# Patient Record
Sex: Female | Born: 1975
Health system: Southern US, Community
[De-identification: ages and names within clinical notes are randomized; demographics above are authoritative.]

## PROBLEM LIST (undated history)

## (undated) DIAGNOSIS — N939 Abnormal uterine and vaginal bleeding, unspecified: Secondary | ICD-10-CM

## (undated) DIAGNOSIS — T7840XA Allergy, unspecified, initial encounter: Secondary | ICD-10-CM

## (undated) DIAGNOSIS — G5603 Carpal tunnel syndrome, bilateral upper limbs: Secondary | ICD-10-CM

## (undated) DIAGNOSIS — F319 Bipolar disorder, unspecified: Secondary | ICD-10-CM

## (undated) DIAGNOSIS — E079 Disorder of thyroid, unspecified: Secondary | ICD-10-CM

## (undated) DIAGNOSIS — D649 Anemia, unspecified: Secondary | ICD-10-CM

## (undated) DIAGNOSIS — N946 Dysmenorrhea, unspecified: Secondary | ICD-10-CM

## (undated) DIAGNOSIS — F909 Attention-deficit hyperactivity disorder, unspecified type: Secondary | ICD-10-CM

## (undated) DIAGNOSIS — F419 Anxiety disorder, unspecified: Secondary | ICD-10-CM

## (undated) DIAGNOSIS — E282 Polycystic ovarian syndrome: Secondary | ICD-10-CM

## (undated) DIAGNOSIS — F329 Major depressive disorder, single episode, unspecified: Secondary | ICD-10-CM

## (undated) DIAGNOSIS — R42 Dizziness and giddiness: Secondary | ICD-10-CM

## (undated) DIAGNOSIS — F32A Depression, unspecified: Secondary | ICD-10-CM

## (undated) DIAGNOSIS — R87619 Unspecified abnormal cytological findings in specimens from cervix uteri: Secondary | ICD-10-CM

## (undated) DIAGNOSIS — R7989 Other specified abnormal findings of blood chemistry: Secondary | ICD-10-CM

## (undated) DIAGNOSIS — G4730 Sleep apnea, unspecified: Secondary | ICD-10-CM

## (undated) DIAGNOSIS — A64 Unspecified sexually transmitted disease: Secondary | ICD-10-CM

## (undated) HISTORY — DX: Unspecified sexually transmitted disease: A64

## (undated) HISTORY — DX: Polycystic ovarian syndrome: E28.2

## (undated) HISTORY — DX: Disorder of thyroid, unspecified: E07.9

## (undated) HISTORY — DX: Bipolar disorder, unspecified: F31.9

## (undated) HISTORY — DX: Allergy, unspecified, initial encounter: T78.40XA

## (undated) HISTORY — DX: Carpal tunnel syndrome, bilateral upper limbs: G56.03

## (undated) HISTORY — PX: COLPOSCOPY: SHX161

## (undated) HISTORY — PX: BELPHAROPTOSIS REPAIR: SHX369

## (undated) HISTORY — DX: Dysmenorrhea, unspecified: N94.6

## (undated) HISTORY — PX: GUM SURGERY: SHX658

## (undated) HISTORY — DX: Abnormal uterine and vaginal bleeding, unspecified: N93.9

## (undated) HISTORY — PX: UPPER GASTROINTESTINAL ENDOSCOPY: SHX188

## (undated) HISTORY — DX: Depression, unspecified: F32.A

## (undated) HISTORY — DX: Attention-deficit hyperactivity disorder, unspecified type: F90.9

## (undated) HISTORY — DX: Other specified abnormal findings of blood chemistry: R79.89

## (undated) HISTORY — DX: Major depressive disorder, single episode, unspecified: F32.9

## (undated) HISTORY — DX: Anemia, unspecified: D64.9

## (undated) HISTORY — DX: Anxiety disorder, unspecified: F41.9

## (undated) HISTORY — DX: Unspecified abnormal cytological findings in specimens from cervix uteri: R87.619

## (undated) HISTORY — DX: Dizziness and giddiness: R42

## (undated) HISTORY — PX: EYE SURGERY: SHX253

## (undated) HISTORY — DX: Sleep apnea, unspecified: G47.30

---
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
## 1998-12-01 ENCOUNTER — Other Ambulatory Visit: Admission: RE | Admit: 1998-12-01 | Discharge: 1998-12-01 | Payer: Self-pay | Admitting: Obstetrics and Gynecology

## 2000-01-08 ENCOUNTER — Ambulatory Visit (HOSPITAL_COMMUNITY): Admission: RE | Admit: 2000-01-08 | Discharge: 2000-01-08 | Payer: Self-pay | Admitting: Internal Medicine

## 2000-01-08 ENCOUNTER — Encounter (INDEPENDENT_AMBULATORY_CARE_PROVIDER_SITE_OTHER): Payer: Self-pay

## 2003-03-28 ENCOUNTER — Encounter: Payer: Self-pay | Admitting: Neurology

## 2003-03-28 ENCOUNTER — Ambulatory Visit (HOSPITAL_COMMUNITY): Admission: RE | Admit: 2003-03-28 | Discharge: 2003-03-28 | Payer: Self-pay | Admitting: Neurology

## 2007-02-04 ENCOUNTER — Other Ambulatory Visit: Admission: RE | Admit: 2007-02-04 | Discharge: 2007-02-04 | Payer: Self-pay | Admitting: Gynecology

## 2008-08-05 HISTORY — PX: CARPAL TUNNEL RELEASE: SHX101

## 2010-12-21 ENCOUNTER — Other Ambulatory Visit (HOSPITAL_COMMUNITY): Payer: Self-pay | Admitting: Neurology

## 2010-12-21 DIAGNOSIS — G253 Myoclonus: Secondary | ICD-10-CM

## 2010-12-21 DIAGNOSIS — R42 Dizziness and giddiness: Secondary | ICD-10-CM

## 2010-12-21 DIAGNOSIS — R209 Unspecified disturbances of skin sensation: Secondary | ICD-10-CM

## 2010-12-21 NOTE — Procedures (Signed)
[facility]  Patient:    LAPARIS, DURRETT               MRN: [id number] Adm. Date:  [id number] Disc. Date: [id number] Attending:  Mervin Hack CC:         Hedwig Morton. Juanda Chance, M.D. [facility]                           Procedure Report  PROCEDURE:  Flexible sigmoidoscopy.  INDICATIONS:  This 35 year old white female presented with two-month history of intermittent rectal bleeding.  In the past few days, there has been a large amount of blood in the commode, not associated with any rectal pain.  On physical exam yesterday, stool was Hemoccult-positive and she had tenderness in the left lower quadrant.  Her hemoglobin was 12.5.  She had small hemorrhoids which did not appear to be bleeding.  She is undergoing flexible sigmoidoscopy to rule out inflammatory bowel disease of the left colon.  ENDOSCOPIST:  Hedwig Morton. Juanda Chance, M.D.  ENDOSCOPE:  Olympus single-channel videoscope.  SEDATION:  Versed 7.5 IV, Demerol 100 mg IV.  FINDINGS:  Olympus single-channel videoscope was passed directly into the rectum to the sigmoid colon.  Patient was monitored by pulse oximetry; her oxygen saturations were normal.  Anal canal and perianal area were normal. There was a macerated area at the dentate line, with some mucosal abrasions and circumferential edema and hyperemia.  This was consistent with anusitis. Retroflexion of the endoscope in the rectum did not reveal any significant hemorrhoids.  Mucosa of the rectal ampulla was otherwise normal.  No evidence of proctitis.  Mucosa through the sigmoid colon appeared normal except for some patchy erythema which was attributed to the scope and prep.  Biopsies were taken from 10 to 15 cm to rule out microscopic colitis.  Colonoscope passed easily through the descending colon to the splenic flexure and to the mid-transverse colon.  Mucosa appeared normal.  At that point, colonoscope was then retracted and the colon decompressed.   Patient tolerated the procedure well.  IMPRESSION:  Anusitis; otherwise, normal colon to 90 cm, status post random biopsies of the rectosigmoid.  PLAN: 1. Treat for anal disease using Analpram-HC cream 2.5%. 2. Anusol-HC suppository q.h.s. 3. High-fiber diet. 4. Fibercon two tablets daily. 5. Modify her eating habits to include three meals a day and lots of liquids. DD:  01/08/00 TD:  01/11/00 Job: [id number]

## 2010-12-25 ENCOUNTER — Ambulatory Visit (HOSPITAL_COMMUNITY)
Admission: RE | Admit: 2010-12-25 | Discharge: 2010-12-25 | Disposition: A | Discharge status: Home / Self Care | Payer: PRIVATE HEALTH INSURANCE | Source: Ambulatory Visit | Attending: Neurology | Admitting: Neurology

## 2010-12-25 DIAGNOSIS — R209 Unspecified disturbances of skin sensation: Secondary | ICD-10-CM | POA: Insufficient documentation

## 2010-12-25 DIAGNOSIS — R259 Unspecified abnormal involuntary movements: Secondary | ICD-10-CM | POA: Insufficient documentation

## 2010-12-25 DIAGNOSIS — R42 Dizziness and giddiness: Secondary | ICD-10-CM

## 2010-12-25 DIAGNOSIS — G253 Myoclonus: Secondary | ICD-10-CM

## 2010-12-25 MED ORDER — GADOBENATE DIMEGLUMINE 529 MG/ML IV SOLN
15.0000 mL | Freq: Once | INTRAVENOUS | Status: AC
  Administered 2010-12-25: 15 mL via INTRAVENOUS

## 2012-01-31 ENCOUNTER — Ambulatory Visit (HOSPITAL_BASED_OUTPATIENT_CLINIC_OR_DEPARTMENT_OTHER): Payer: PRIVATE HEALTH INSURANCE | Attending: Internal Medicine

## 2012-01-31 VITALS — Ht 65.0 in | Wt 157.0 lb

## 2012-01-31 DIAGNOSIS — G4733 Obstructive sleep apnea (adult) (pediatric): Secondary | ICD-10-CM

## 2012-01-31 DIAGNOSIS — G4700 Insomnia, unspecified: Secondary | ICD-10-CM | POA: Insufficient documentation

## 2012-01-31 DIAGNOSIS — G4730 Sleep apnea, unspecified: Secondary | ICD-10-CM | POA: Insufficient documentation

## 2012-02-06 DIAGNOSIS — G4730 Sleep apnea, unspecified: Secondary | ICD-10-CM

## 2012-02-06 DIAGNOSIS — G4700 Insomnia, unspecified: Secondary | ICD-10-CM

## 2012-02-06 NOTE — Procedures (Signed)
NAME:  Lisa Whitney, SIGNORE        ACCOUNT NO.:  [id number]  MEDICAL RECORD NO.:  [id number]          PATIENT TYPE:  OUT  LOCATION:  SLEEP CENTER                 FACILITY[facility]  [facility]  PHYSICIAN:  Mistey Hoffert D. Maple Hudson, MD, FCCP, FACPDATE OF BIRTH:  05-18-1976  DATE OF STUDY:  01/31/2012                           NOCTURNAL POLYSOMNOGRAM  REFERRING PHYSICIAN:  Gwen Pounds, MD  REFERRING DOCTOR:  Gwen Pounds, MD  INDICATION FOR STUDY:  Insomnia with sleep apnea.  EPWORTH SLEEPINESS SCORE:  12/24.  BMI:  26.1.  Weight 157 pounds, height 65 inches, neck 13 inches.  HOME MEDICATIONS:  Charted and reviewed.  SLEEP ARCHITECTURE:  Total sleep time 211.5 minutes with sleep efficiency 51.6%.  Stage I was 13.9%, stage II was 73%, stage III was 11.8%, REM 1.2% of total sleep time.  Sleep latency 16 minutes, REM latency 61.5 minutes, awake after sleep onset 170.5 minutes, arousal index 9.6.  Bedtime medications:  Lamotrigine, Xanax, metformin.  Sleep pattern was marked by frequent and sustained episodes of spontaneous wakefulness with almost no REM.  RESPIRATORY DATA:  Apnea-hypopnea index (AHI) 0 per hour.  No respiratory events met scoring criteria.  OXYGEN DATA:  Moderate snoring with oxygen desaturation to a nadir of 96% and mean oxygen saturation through the study of 97.5% on room air.  CARDIAC DATA:  Sinus rhythm with occasional PAC.  MOVEMENT/PARASOMNIA:  A few limb jerks were counted with insignificant effect on sleep.  Bathroom x1.  IMPRESSION/RECOMMENDATIONS: 1. Sleep architecture was significant for repeated spontaneous     intervals of wakefulness throughout the night and very little REM.     Consider managing as an insomnia. 2. No respiratory events associated with sleep disturbance, within     normal limits.  AHI 0 per hour (the normal     range for adults is from 0-5 events per hour).  Moderate snoring     with oxygen desaturation to a nadir of a 96% and mean oxygen  saturation through the study of 97.5% on room air.     Raylene Carmickle D. Maple Hudson, MD, [facility], FACP Diplomate, American Board of Sleep Medicine    CDY/MEDQ  D:  02/06/2012 09:11:14  T:  02/06/2012 18:54:18  Job:  [id number]

## 2012-02-11 ENCOUNTER — Encounter (HOSPITAL_BASED_OUTPATIENT_CLINIC_OR_DEPARTMENT_OTHER): Payer: PRIVATE HEALTH INSURANCE

## 2012-05-14 LAB — HM PAP SMEAR: HM Pap smear: NEGATIVE

## 2012-08-05 HISTORY — PX: CO2 LASER APPLICATION: SHX5778

## 2012-11-03 ENCOUNTER — Telehealth: Payer: Self-pay | Admitting: Gynecology

## 2012-11-03 MED ORDER — METFORMIN HCL 500 MG PO TABS: 500.0000 mg | ORAL_TABLET | Freq: Two times a day (BID) | ORAL | Status: DC

## 2012-11-03 NOTE — Telephone Encounter (Signed)
Metformin 500 mg #60/10 rf;s sent to [facility] pt is aware.

## 2012-11-03 NOTE — Telephone Encounter (Signed)
Per Dr. Farrel Gobble #60/10 rf's approved and sent to [facility]; pt is aware

## 2012-11-03 NOTE — Telephone Encounter (Signed)
Pt calling to get an refill on her medication. Pt states the pharmacy has been waiting for over a week with no response. Pt states she is completely out

## 2012-11-04 ENCOUNTER — Telehealth: Payer: Self-pay | Admitting: *Deleted

## 2012-11-05 NOTE — Telephone Encounter (Signed)
i thought I ok'd this yesterday, please confirm

## 2012-11-05 NOTE — Telephone Encounter (Signed)
Done by Reggy Eye 11/03/12

## 2012-11-10 ENCOUNTER — Other Ambulatory Visit: Payer: Self-pay

## 2012-11-10 DIAGNOSIS — Z1231 Encounter for screening mammogram for malignant neoplasm of breast: Secondary | ICD-10-CM

## 2012-11-11 ENCOUNTER — Ambulatory Visit
Admission: RE | Admit: 2012-11-11 | Discharge: 2012-11-11 | Disposition: A | Discharge status: Home / Self Care | Payer: No Typology Code available for payment source | Source: Ambulatory Visit

## 2012-11-11 DIAGNOSIS — Z1231 Encounter for screening mammogram for malignant neoplasm of breast: Secondary | ICD-10-CM

## 2012-11-12 ENCOUNTER — Ambulatory Visit (INDEPENDENT_AMBULATORY_CARE_PROVIDER_SITE_OTHER): Payer: BC Managed Care – PPO | Admitting: Physician Assistant

## 2012-11-12 VITALS — BP 116/72 | HR 81 | Temp 98.4°F | Resp 18 | Ht 65.0 in | Wt 150.0 lb

## 2012-11-12 DIAGNOSIS — E282 Polycystic ovarian syndrome: Secondary | ICD-10-CM | POA: Insufficient documentation

## 2012-11-12 DIAGNOSIS — H60399 Other infective otitis externa, unspecified ear: Secondary | ICD-10-CM

## 2012-11-12 DIAGNOSIS — H6692 Otitis media, unspecified, left ear: Secondary | ICD-10-CM

## 2012-11-12 DIAGNOSIS — H60392 Other infective otitis externa, left ear: Secondary | ICD-10-CM

## 2012-11-12 DIAGNOSIS — F319 Bipolar disorder, unspecified: Secondary | ICD-10-CM | POA: Insufficient documentation

## 2012-11-12 DIAGNOSIS — H6690 Otitis media, unspecified, unspecified ear: Secondary | ICD-10-CM

## 2012-11-12 DIAGNOSIS — E039 Hypothyroidism, unspecified: Secondary | ICD-10-CM | POA: Insufficient documentation

## 2012-11-12 MED ORDER — AMOXICILLIN 875 MG PO TABS: 875.0000 mg | ORAL_TABLET | Freq: Two times a day (BID) | ORAL | Status: AC

## 2012-11-12 MED ORDER — OFLOXACIN 0.3 % OT SOLN: 10.0000 [drp] | Freq: Every day | OTIC | Status: AC

## 2012-11-12 NOTE — Progress Notes (Signed)
   [address], [place] [address]   Phone [phone]  Subjective:    Patient ID: Lisa Whitney, female    DOB: August 20, 1975, 37 y.o.   MRN: [id number]  HPI  Pt presents to clinic with acute L ear pain that started this afternoon that has gotten progressively worse.  It is radiating into her throat and behind her eye.  She has had no recent colds or allergy symptoms.  No trauma to ear that she knows of.  She tried a homeopathic ear gtt and she got no relief.  She is having decreased hearing in that ear - everything is muffled and echoing.  Review of Systems  Constitutional: Negative for fever and chills.  HENT: Positive for hearing loss (just started after she put drops in her ear) and ear pain (L side). Negative for congestion, sore throat, rhinorrhea and ear discharge.   Respiratory: Negative for cough.        Objective:   Physical Exam  Vitals reviewed. Constitutional: She is oriented to person, place, and time. She appears well-developed and well-nourished.  HENT:  Head: Normocephalic and atraumatic.  Right Ear: Hearing, tympanic membrane, external ear and ear canal normal.  Left Ear: Hearing normal. There is swelling (mild swelling of external canal) and tenderness (tragus TTP). No foreign bodies. Tympanic membrane is erythematous and bulging (purulent drainage on her TM).  Cardiovascular: Normal rate, regular rhythm and normal heart sounds.   Pulmonary/Chest: Effort normal and breath sounds normal.  Neurological: She is alert and oriented to person, place, and time.  Skin: Skin is warm and dry.  Psychiatric: She has a normal mood and affect. Her behavior is normal. Judgment and thought content normal.       Assessment & Plan:  Otitis media, left - Plan: amoxicillin (AMOXIL) 875 MG tablet  Otitis, externa, infective, left - Plan: ofloxacin (FLOXIN) 0.3 % otic solution  Benny Lennert PA-C 11/12/2012 9:40 PM

## 2013-05-15 ENCOUNTER — Emergency Department (INDEPENDENT_AMBULATORY_CARE_PROVIDER_SITE_OTHER): Payer: BC Managed Care – PPO

## 2013-05-15 ENCOUNTER — Encounter (HOSPITAL_COMMUNITY): Payer: Self-pay | Admitting: Emergency Medicine

## 2013-05-15 ENCOUNTER — Emergency Department (HOSPITAL_COMMUNITY)
Admission: EM | Admit: 2013-05-15 | Discharge: 2013-05-15 | Disposition: A | Discharge status: Home / Self Care | Payer: BC Managed Care – PPO | Source: Home / Self Care

## 2013-05-15 DIAGNOSIS — R1011 Right upper quadrant pain: Secondary | ICD-10-CM

## 2013-05-15 DIAGNOSIS — R1012 Left upper quadrant pain: Secondary | ICD-10-CM

## 2013-05-15 DIAGNOSIS — K5900 Constipation, unspecified: Secondary | ICD-10-CM

## 2013-05-15 DIAGNOSIS — R1031 Right lower quadrant pain: Secondary | ICD-10-CM

## 2013-05-15 DIAGNOSIS — R1032 Left lower quadrant pain: Secondary | ICD-10-CM

## 2013-05-15 LAB — POCT URINALYSIS DIP (DEVICE)
Glucose, UA: NEGATIVE mg/dL
Ketones, ur: NEGATIVE mg/dL
Leukocytes, UA: NEGATIVE
Protein, ur: NEGATIVE mg/dL

## 2013-05-15 LAB — POCT PREGNANCY, URINE: Preg Test, Ur: NEGATIVE

## 2013-05-15 MED ORDER — POLYETHYLENE GLYCOL 3350 17 GM/SCOOP PO POWD: 17.0000 g | Freq: Every day | ORAL | Status: DC

## 2013-05-15 NOTE — ED Notes (Signed)
Pt c/o epigastric and lower abd pain onset Thursday Sxs include: nauseas, weakness, dizziness, decreased appetite and not keeping fluids down abd pain increases w/deep breaths Denies: f/v/d, constipation, urinary sxs, gyn sxs She is alert w/no signs of acute distress.

## 2013-05-15 NOTE — ED Provider Notes (Signed)
CSN: [id number]     Arrival date & time 05/15/13  1200 History   First MD Initiated Contact with Patient 05/15/13 1357     Chief Complaint  Patient presents with  . Abdominal Pain   (Consider location/radiation/quality/duration/timing/severity/associated sxs/prior Treatment) HPI Comments: And 37 year old female presenting with generalized number pain for 48 hours. The pain is primarily across the upper abdomen and epigastrium. She states eating food or drinking water or other liquids produce a sudden pain across the upper abdomen. She has nausea but no vomiting. Decreased appetite but no fever. She initially thought she was constipated but then file not. She has been having some bowel movement she states her last good cleansing bowel movement she cannot remember.   Past Medical History  Diagnosis Date  . Anxiety   . Depression   . Anemia   . Thyroid disease   . Bipolar affective disorder   . PCOS (polycystic ovarian syndrome)    Past Surgical History  Procedure Laterality Date  . Carpal tunnel release    . Eye surgery     Family History  Problem Relation Age of Onset  . Cancer Mother    History  Substance Use Topics  . Smoking status: Former Games developer  . Smokeless tobacco: Not on file  . Alcohol Use: Yes   OB History   Grav Para Term Preterm Abortions TAB SAB Ect Mult Living                 Review of Systems  Constitutional: Positive for appetite change. Negative for fever, chills and fatigue.  HENT: Negative.   Respiratory: Negative.   Cardiovascular: Negative.   Gastrointestinal: Positive for nausea, abdominal pain and anal bleeding. Negative for vomiting, diarrhea, abdominal distention and rectal pain.  Genitourinary: Negative.   Musculoskeletal: Negative.   Skin: Negative.   Neurological: Negative.     Allergies  Review of patient's allergies indicates no known allergies.  Home Medications   Current Outpatient Rx  Name  Route  Sig  Dispense  Refill  .  ALPRAZolam (XANAX) 1 MG tablet   Oral   Take 1 mg by mouth at bedtime as needed for sleep.         [name] amphetamine-dextroamphetamine (ADDERALL) 30 MG tablet   Oral   Take 30 mg by mouth daily.         [name] lamoTRIgine (LAMICTAL) 200 MG tablet   Oral   Take 300 mg by mouth daily.         [name] levothyroxine (SYNTHROID, LEVOTHROID) 100 MCG tablet   Oral   Take 100 mcg by mouth daily before breakfast.         . lurasidone (LATUDA) 40 MG TABS   Oral   Take by mouth daily with breakfast.         . metFORMIN (GLUCOPHAGE) 500 MG tablet   Oral   Take 1 tablet (500 mg total) by mouth 2 (two) times daily with a meal.   60 tablet   10   . polyethylene glycol powder (GLYCOLAX/MIRALAX) powder   Oral   Take 17 g by mouth daily.   255 g   0    BP 117/67  Pulse 80  Temp(Src) 98.3 F (36.8 C) (Oral)  Resp 23  SpO2 99%  LMP 05/05/2013 Physical Exam  Nursing note and vitals reviewed. Constitutional: She is oriented to person, place, and time. She appears well-developed and well-nourished. No distress.  Eyes: EOM are normal.  Neck: Normal range  of motion. Neck supple.  Cardiovascular: Normal rate, regular rhythm and normal heart sounds.   No murmur heard. Pulmonary/Chest: Effort normal and breath sounds normal. No respiratory distress. She has no wheezes.  Abdominal: Soft. She exhibits no distension and no mass. There is tenderness. There is no rebound and no guarding.  Bowel sounds hyperactive are present in all quadrants. Tenderness to light palpation across the upper abdomen and epigastrium. Lesser tenderness across the lower abdomen.  Musculoskeletal: She exhibits no edema and no tenderness.  Neurological: She is alert and oriented to person, place, and time. She exhibits normal muscle tone.  Skin: Skin is warm and dry. No rash noted.  Psychiatric: She has a normal mood and affect.    ED Course  Procedures (including critical care time) Labs Review Labs Reviewed  POCT  URINALYSIS DIP (DEVICE)  POCT PREGNANCY, URINE   Imaging Review No results found.  No results found. Results for orders placed during the hospital encounter of 05/15/13  POCT URINALYSIS DIP (DEVICE)      Result Value Range   Glucose, UA NEGATIVE  NEGATIVE mg/dL   Bilirubin Urine NEGATIVE  NEGATIVE   Ketones, ur NEGATIVE  NEGATIVE mg/dL   Specific Gravity, Urine 1.010  1.005 - 1.030   Hgb urine dipstick NEGATIVE  NEGATIVE   pH 7.0  5.0 - 8.0   Protein, ur NEGATIVE  NEGATIVE mg/dL   Urobilinogen, UA 0.2  0.0 - 1.0 mg/dL   Nitrite NEGATIVE  NEGATIVE   Leukocytes, UA NEGATIVE  NEGATIVE  POCT PREGNANCY, URINE      Result Value Range   Preg Test, Ur NEGATIVE  NEGATIVE     MDM   1. Abdominal pain, bilateral upper quadrant   2. Abdominal pain, bilateral lower quadrant   3. Constipation      Moderate to large stool burden, gas. Drink plenty of water Miralax as directed High fiber diet. Follow with your doctor if not improving. If getting worse go to the ED   Hayden Rasmussen, NP 05/15/13 1455

## 2013-05-16 NOTE — ED Provider Notes (Signed)
Medical screening examination/treatment/procedure(s) were performed by resident physician or non-physician practitioner and as supervising physician I was immediately available for consultation/collaboration.   Barkley Bruns MD.   Linna Hoff, MD 05/16/13 1239

## 2013-10-26 ENCOUNTER — Other Ambulatory Visit: Payer: Self-pay | Admitting: *Deleted

## 2013-10-26 NOTE — Telephone Encounter (Signed)
Last AEX 05/14/2012 Last refill 11/03/2012 #60/10 refills No future appt scheduled. Paper Chart in your door.  Please approve or deny rx.

## 2013-10-26 NOTE — Telephone Encounter (Signed)
NEEDS APPT FOR F/U/ANNUAL THEN CAN REFILL TO APPT

## 2013-10-27 MED ORDER — METFORMIN HCL 500 MG PO TABS: 500.0000 mg | ORAL_TABLET | Freq: Two times a day (BID) | ORAL | Status: DC

## 2013-10-27 NOTE — Addendum Note (Signed)
Addended by: Dion BodyBELTRAN, Richell Corker C on: 10/27/2013 05:09 PM   Modules accepted: Orders

## 2013-10-27 NOTE — Telephone Encounter (Signed)
Patient made appt for 11/24/2013 @5 :30pm Will send Rx for 1 month until appt. - Pt agreed.

## 2013-11-24 ENCOUNTER — Encounter: Payer: Self-pay | Admitting: Gynecology

## 2013-11-24 ENCOUNTER — Ambulatory Visit (INDEPENDENT_AMBULATORY_CARE_PROVIDER_SITE_OTHER): Payer: BC Managed Care – PPO | Admitting: Gynecology

## 2013-11-24 VITALS — BP 108/60 | HR 80 | Resp 20 | Ht 65.5 in | Wt 148.0 lb

## 2013-11-24 DIAGNOSIS — Z01419 Encounter for gynecological examination (general) (routine) without abnormal findings: Secondary | ICD-10-CM

## 2013-11-24 DIAGNOSIS — R1032 Left lower quadrant pain: Secondary | ICD-10-CM

## 2013-11-24 DIAGNOSIS — E039 Hypothyroidism, unspecified: Secondary | ICD-10-CM

## 2013-11-24 DIAGNOSIS — E282 Polycystic ovarian syndrome: Secondary | ICD-10-CM

## 2013-11-24 MED ORDER — METFORMIN HCL 500 MG PO TABS: 500.0000 mg | ORAL_TABLET | Freq: Two times a day (BID) | ORAL | Status: DC

## 2013-11-24 NOTE — Patient Instructions (Addendum)
Ask Dr Timothy Lassousso regarding testosterone and free testosterone levels PUS for f/u PCOS and LLQ pain

## 2013-11-24 NOTE — Progress Notes (Signed)
[age] y.o. @Single  @Caucasian  female   G1P0010 here for annual exam. Pt is not currently sexually active.  Pt has been on metformin tiwce a day and her cycles are improved, mostly monthly, occaisional irregular.  Pt lived in [place] before onset of her symptoms,  Pt reports recent heavy metal testing, reports increase in aluminum and lead.    Patient's last menstrual period was 11/15/2013.          Sexually active: no  The current method of family planning is none.    Exercising: yes  Walking, Pilates Last pap: 05/2012 Neg. HR HPV Neg Alcohol: Yes, 2 glasses of wine a week Tobacco: No BSE: No MMG: 11/2012 BI-RADS 1: Neg  PCP: Dr Timothy Lassousso-  Health Maintenance  Topic Date Due  . Pap Smear  05/22/1994  . Tetanus/tdap  05/23/1995  . Influenza Vaccine  03/05/2014    Family History  Problem Relation Age of Onset  . Cancer Mother   . Hypertension Father   . Hyperlipidemia Father   . Cancer Maternal Grandmother   . Cancer Paternal Grandmother   . Heart disease Paternal Grandmother   . Diabetes Paternal Grandmother     Patient Active Problem List   Diagnosis Date Noted  . Bipolar 1 disorder 11/12/2012  . PCOS (polycystic ovarian syndrome) 11/12/2012  . Unspecified hypothyroidism 11/12/2012    Past Medical History  Diagnosis Date  . Anxiety   . Depression   . Anemia   . Thyroid disease   . Bipolar affective disorder   . PCOS (polycystic ovarian syndrome)   . ADHD (attention deficit hyperactivity disorder) 1990  . Carpal tunnel syndrome, bilateral   . Vertigo     Past Surgical History  Procedure Laterality Date  . Carpal tunnel release  2010    Left only  . Eye surgery      Lasic  . Co2 laser application  2014    Allergies: Review of patient's allergies indicates no known allergies.  Current Outpatient Prescriptions  Medication Sig Dispense Refill  . ALPRAZolam (XANAX) 1 MG tablet Take 1 mg by mouth at bedtime as needed for sleep.      [name]. amphetamine-dextroamphetamine  (ADDERALL XR) 20 MG 24 hr capsule Take 20 mg by mouth daily.      . Cholecalciferol 50000 UNITS TABS Take by mouth once a week.      . Iron Combinations (IRON COMPLEX) CAPS Take by mouth daily.      [name]. levothyroxine (SYNTHROID, LEVOTHROID) 75 MCG tablet Take 75 mcg by mouth daily before breakfast.      . metFORMIN (GLUCOPHAGE) 500 MG tablet Take 1 tablet (500 mg total) by mouth 2 (two) times daily with a meal.  60 tablet  0  . Multiple Vitamin (MULTIVITAMIN) tablet Take 1 tablet by mouth daily.      . Omega-3 Fatty Acids (OMEGA 3 PO) Take by mouth.      . Probiotic Product (PROBIOTIC DAILY PO) Take by mouth daily.      [name]. venlafaxine XR (EFFEXOR-XR) 37.5 MG 24 hr capsule Take 37.5 mg by mouth daily with breakfast.        No current facility-administered medications for this visit.    ROS: Pertinent items are noted in HPI.  Exam:    BP 108/60  Pulse 80  Resp 20  Ht 5' 5.5" (1.664 m)  Wt 148 lb (67.132 kg)  BMI 24.25 kg/m2  LMP 11/15/2013 Weight change: @WEIGHTCHANGE @ Last 3 height recordings:  Ht Readings from  Last 3 Encounters:  11/24/13 5' 5.5" (1.664 m)  11/12/12 5\' 5"  (1.651 m)  02/01/12 5\' 5"  (1.651 m)   General appearance: alert, cooperative and appears stated age Head: Normocephalic, without obvious abnormality, atraumatic Neck: no adenopathy, no carotid bruit, no JVD, supple, symmetrical, trachea midline and thyroid not enlarged, symmetric, no tenderness/mass/nodules Lungs: clear to auscultation bilaterally Breasts: normal appearance, no masses or tenderness Heart: regular rate and rhythm, S1, S2 normal, no murmur, click, rub or gallop Abdomen: soft, non-tender; bowel sounds normal; no masses,  no organomegaly Extremities: extremities normal, atraumatic, no cyanosis or edema Skin: Skin color, texture, turgor normal. No rashes or lesions Lymph nodes: Cervical, supraclavicular, and axillary nodes normal. no inguinal nodes palpated Neurologic: Grossly normal   Pelvic:  External genitalia:  no lesions              Urethra: normal appearing urethra with no masses, tenderness or lesions              Bartholins and Skenes: normal                 Vagina: normal appearing vagina with normal color and discharge, no lesions              Cervix: normal appearance and deviated to left              Pap taken: no        Bimanual Exam:  Uterus:  uterus is normal size, shape, consistency and nontender, retroverted                                      Adnexa:    not indicated, normal adnexa in size, nontender and no masses and tenderness left                                      Rectovaginal: Confirms                                      Anus:  normal sphincter tone, no lesions  A: well woman PCOS LLQ pain     P: refill glucophage Will get T4 checked at PCP and forward labs, will treat hirsuitism accordingly PUS for LLQ pain counseled on breast self exam, adequate intake of calcium and vitamin D, diet and exercise return annually or prn   An After Visit Summary was printed and given to the patient.

## 2013-11-29 ENCOUNTER — Telehealth: Payer: Self-pay | Admitting: Gynecology

## 2013-11-29 NOTE — Telephone Encounter (Signed)
Patient is prepping for presentation. Wants a call back tomorrow to discuss benefits and scheduling of PUS

## 2013-11-30 NOTE — Telephone Encounter (Signed)
Left message for patient to call back  

## 2013-12-06 NOTE — Telephone Encounter (Signed)
Left message for patient to call back  

## 2013-12-10 NOTE — Telephone Encounter (Signed)
Left message for patient to call back  

## 2013-12-13 NOTE — Telephone Encounter (Signed)
**  mailed letter to patient** Dec 13, 2013      Dear Lisa Whitney,  Upon requesting authorization for the ultrasound that was recommended by your provider, your insurance company has informed us that they will cover 100% of the charges after a $15 copay.  Please call us to schedule this procedure.  If you have any questions, please call us at [phone].

## 2014-01-06 ENCOUNTER — Other Ambulatory Visit: Payer: Self-pay | Admitting: Gynecology

## 2014-01-07 NOTE — Telephone Encounter (Signed)
Last refilled: AEX 11/24/13 #60/0 refills was sent to pharmacy  No AEX scheduled for 2016  Please Advise.

## 2014-02-10 ENCOUNTER — Telehealth: Payer: Self-pay | Admitting: Gynecology

## 2014-02-17 NOTE — Telephone Encounter (Signed)
Opened encounter in error  

## 2014-03-28 ENCOUNTER — Ambulatory Visit (INDEPENDENT_AMBULATORY_CARE_PROVIDER_SITE_OTHER): Payer: BC Managed Care – PPO | Admitting: Gynecology

## 2014-03-28 ENCOUNTER — Telehealth: Payer: Self-pay | Admitting: Gynecology

## 2014-03-28 ENCOUNTER — Encounter: Payer: Self-pay | Admitting: Gynecology

## 2014-03-28 VITALS — BP 124/78 | HR 64 | Resp 12 | Ht 65.5 in | Wt 151.0 lb

## 2014-03-28 DIAGNOSIS — N949 Unspecified condition associated with female genital organs and menstrual cycle: Secondary | ICD-10-CM

## 2014-03-28 DIAGNOSIS — R1032 Left lower quadrant pain: Secondary | ICD-10-CM

## 2014-03-28 DIAGNOSIS — N938 Other specified abnormal uterine and vaginal bleeding: Secondary | ICD-10-CM

## 2014-03-28 NOTE — Patient Instructions (Signed)
Levonorgestrel intrauterine device (IUD) What is this medicine? LEVONORGESTREL IUD (LEE voe nor jes trel) is a contraceptive (birth control) device. The device is placed inside the uterus by a healthcare professional. It is used to prevent pregnancy and can also be used to treat heavy bleeding that occurs during your period. Depending on the device, it can be used for 3 to 5 years. This medicine may be used for other purposes; ask your health care provider or pharmacist if you have questions. COMMON BRAND NAME(S): LILETTA, Mirena, Skyla What should I tell my health care provider before I take this medicine? They need to know if you have any of these conditions: -abnormal Pap smear -cancer of the breast, uterus, or cervix -diabetes -endometritis -genital or pelvic infection now or in the past -have more than one sexual partner or your partner has more than one partner -heart disease -history of an ectopic or tubal pregnancy -immune system problems -IUD in place -liver disease or tumor -problems with blood clots or take blood-thinners -use intravenous drugs -uterus of unusual shape -vaginal bleeding that has not been explained -an unusual or allergic reaction to levonorgestrel, other hormones, silicone, or polyethylene, medicines, foods, dyes, or preservatives -pregnant or trying to get pregnant -breast-feeding How should I use this medicine? This device is placed inside the uterus by a health care professional. Talk to your pediatrician regarding the use of this medicine in children. Special care may be needed. Overdosage: If you think you have taken too much of this medicine contact a poison control center or emergency room at once. NOTE: This medicine is only for you. Do not share this medicine with others. What if I miss a dose? This does not apply. What may interact with this medicine? Do not take this medicine with any of the following  medications: -amprenavir -bosentan -fosamprenavir This medicine may also interact with the following medications: -aprepitant -barbiturate medicines for inducing sleep or treating seizures -bexarotene -griseofulvin -medicines to treat seizures like carbamazepine, ethotoin, felbamate, oxcarbazepine, phenytoin, topiramate -modafinil -pioglitazone -rifabutin -rifampin -rifapentine -some medicines to treat HIV infection like atazanavir, indinavir, lopinavir, nelfinavir, tipranavir, ritonavir -St. John's wort -warfarin This list may not describe all possible interactions. Give your health care provider a list of all the medicines, herbs, non-prescription drugs, or dietary supplements you use. Also tell them if you smoke, drink alcohol, or use illegal drugs. Some items may interact with your medicine. What should I watch for while using this medicine? Visit your doctor or health care professional for regular check ups. See your doctor if you or your partner has sexual contact with others, becomes HIV positive, or gets a sexual transmitted disease. This product does not protect you against HIV infection (AIDS) or other sexually transmitted diseases. You can check the placement of the IUD yourself by reaching up to the top of your vagina with clean fingers to feel the threads. Do not pull on the threads. It is a good habit to check placement after each menstrual period. Call your doctor right away if you feel more of the IUD than just the threads or if you cannot feel the threads at all. The IUD may come out by itself. You may become pregnant if the device comes out. If you notice that the IUD has come out use a backup birth control method like condoms and call your health care provider. Using tampons will not change the position of the IUD and are okay to use during your period. What side effects may   I notice from receiving this medicine? Side effects that you should report to your doctor or  health care professional as soon as possible: -allergic reactions like skin rash, itching or hives, swelling of the face, lips, or tongue -fever, flu-like symptoms -genital sores -high blood pressure -no menstrual period for 6 weeks during use -pain, swelling, warmth in the leg -pelvic pain or tenderness -severe or sudden headache -signs of pregnancy -stomach cramping -sudden shortness of breath -trouble with balance, talking, or walking -unusual vaginal bleeding, discharge -yellowing of the eyes or skin Side effects that usually do not require medical attention (report to your doctor or health care professional if they continue or are bothersome): -acne -breast pain -change in sex drive or performance -changes in weight -cramping, dizziness, or faintness while the device is being inserted -headache -irregular menstrual bleeding within first 3 to 6 months of use -nausea This list may not describe all possible side effects. Call your doctor for medical advice about side effects. You may report side effects to FDA at 1-800-FDA-1088. Where should I keep my medicine? This does not apply. NOTE: This sheet is a summary. It may not cover all possible information. If you have questions about this medicine, talk to your doctor, pharmacist, or health care provider.  2015, Elsevier/Gold Standard. (2011-08-22 13:54:04)   Ethinyl Estradiol; Etonogestrel vaginal ring What is this medicine? ETHINYL ESTRADIOL; ETONOGESTREL (ETH in il es tra DYE ole; et oh noe JES trel) vaginal ring is a flexible, vaginal ring used as a contraceptive (birth control method). This medicine combines two types of female hormones, an estrogen and a progestin. This ring is used to prevent ovulation and pregnancy. Each ring is effective for one month. This medicine may be used for other purposes; ask your health care provider or pharmacist if you have questions. COMMON BRAND NAME(S): NuvaRing What should I tell my  health care provider before I take this medicine? They need to know if you have or ever had any of these conditions: -abnormal vaginal bleeding -blood vessel disease or blood clots -breast, cervical, endometrial, ovarian, liver, or uterine cancer -diabetes -gallbladder disease -heart disease or recent heart attack -high blood pressure -high cholesterol -kidney disease -liver disease -migraine headaches -stroke -systemic lupus erythematosus (SLE) -tobacco smoker -an unusual or allergic reaction to estrogens, progestins, other medicines, foods, dyes, or preservatives -pregnant or trying to get pregnant -breast-feeding How should I use this medicine? Insert the ring into your vagina as directed. Follow the directions on the prescription label. The ring will remain place for 3 weeks and is then removed for a 1-week break. A new ring is inserted 1 week after the last ring was removed, on the same day of the week. Do not use more often than directed. A patient package insert for the product will be given with each prescription and refill. Read this sheet carefully each time. The sheet may change frequently. Contact your pediatrician regarding the use of this medicine in children. Special care may be needed. This medicine has been used in female children who have started having menstrual periods. Overdosage: If you think you have taken too much of this medicine contact a poison control center or emergency room at once. NOTE: This medicine is only for you. Do not share this medicine with others. What if I miss a dose? You will need to replace your vaginal ring once a month as directed. If the ring should slip out, or if you leave it in longer or shorter than  you should, contact your health care professional for advice. What may interact with this medicine? -acetaminophen -antibiotics or medicines for infections, especially rifampin, rifabutin, rifapentine, and griseofulvin, and possibly  penicillins or tetracyclines -aprepitant -ascorbic acid (vitamin C) -atorvastatin -barbiturate medicines, such as phenobarbital -bosentan -carbamazepine -caffeine -clofibrate -cyclosporine -dantrolene -doxercalciferol -felbamate -grapefruit juice -hydrocortisone -medicines for anxiety or sleeping problems, such as diazepam or temazepam -medicines for diabetes, including pioglitazone -modafinil -mycophenolate -nefazodone -oxcarbazepine -phenytoin -prednisolone -ritonavir or other medicines for HIV infection or AIDS -rosuvastatin -selegiline -soy isoflavones supplements -St. John's wort -tamoxifen or raloxifene -theophylline -thyroid hormones -topiramate -warfarin This list may not describe all possible interactions. Give your health care provider a list of all the medicines, herbs, non-prescription drugs, or dietary supplements you use. Also tell them if you smoke, drink alcohol, or use illegal drugs. Some items may interact with your medicine. What should I watch for while using this medicine? Visit your doctor or health care professional for regular checks on your progress. You will need a regular breast and pelvic exam and Pap smear while on this medicine. Use an additional method of contraception during the first cycle that you use this ring. If you have any reason to think you are pregnant, stop using this medicine right away and contact your doctor or health care professional. If you are using this medicine for hormone related problems, it may take several cycles of use to see improvement in your condition. Smoking increases the risk of getting a blood clot or having a stroke while you are using hormonal birth control, especially if you are more than 38 years old. You are strongly advised not to smoke. This medicine can make your body retain fluid, making your fingers, hands, or ankles swell. Your blood pressure can go up. Contact your doctor or health care professional  if you feel you are retaining fluid. This medicine can make you more sensitive to the sun. Keep out of the sun. If you cannot avoid being in the sun, wear protective clothing and use sunscreen. Do not use sun lamps or tanning beds/booths. If you wear contact lenses and notice visual changes, or if the lenses begin to feel uncomfortable, consult your eye care specialist. In some women, tenderness, swelling, or minor bleeding of the gums may occur. Notify your dentist if this happens. Brushing and flossing your teeth regularly may help limit this. See your dentist regularly and inform your dentist of the medicines you are taking. If you are going to have elective surgery, you may need to stop using this medicine before the surgery. Consult your health care professional for advice. This medicine does not protect you against HIV infection (AIDS) or any other sexually transmitted diseases. What side effects may I notice from receiving this medicine? Side effects that you should report to your doctor or health care professional as soon as possible: -breast tissue changes or discharge -changes in vaginal bleeding during your period or between your periods -chest pain -coughing up blood -dizziness or fainting spells -headaches or migraines -leg, arm or groin pain -severe or sudden headaches -stomach pain (severe) -sudden shortness of breath -sudden loss of coordination, especially on one side of the body -speech problems -symptoms of vaginal infection like itching, irritation or unusual discharge -tenderness in the upper abdomen -vomiting -weakness or numbness in the arms or legs, especially on one side of the body -yellowing of the eyes or skin Side effects that usually do not require medical attention (report to your doctor or health  care professional if they continue or are bothersome): -breakthrough bleeding and spotting that continues beyond the 3 initial cycles of pills -breast  tenderness -mood changes, anxiety, depression, frustration, anger, or emotional outbursts -increased sensitivity to sun or ultraviolet light -nausea -skin rash, acne, or brown spots on the skin -weight gain (slight) This list may not describe all possible side effects. Call your doctor for medical advice about side effects. You may report side effects to FDA at 1-800-FDA-1088. Where should I keep my medicine? Keep out of the reach of children. Store at room temperature between 15 and 30 degrees C (59 and 86 degrees F) for up to 4 months. The product will expire after 4 months. Protect from light. Throw away any unused medicine after the expiration date. NOTE: This sheet is a summary. It may not cover all possible information. If you have questions about this medicine, talk to your doctor, pharmacist, or health care provider.  2015, Elsevier/Gold Standard. (2008-07-07 12:03:58)

## 2014-03-28 NOTE — Telephone Encounter (Signed)
Pt fainted yesterday having extreme pain while on her cycle.

## 2014-03-28 NOTE — Telephone Encounter (Signed)
Spoke with patient at time of incoming call.  She states that for her last two cycles (last cycle started 02/25/14) and this cycle started 03/27/14 she has been having more painful periods.  Patient states that she has been having a lot of "very very dark brown discharge, with a lot of brown clumps and clumps" for two days and then started her cycle with red blood last night. States that last night she was having so much L sided pain that "I was about to pass out and I did but my mom caught me when I was in the bathroom." Patient denies any injury and states she did not fall, that her mother was able to catch her.  Patient changed 1 super tampon this morning in two hours. Patient not currently on birth control and reports has not been sexually active in years. Patient reports dull pain right now. Has taken aleve this morning and is at work now. Offered patient office visit for today with Dr. Farrel Gobble for evaluation. Patient also would like to schedule pelvic ultrasound. Office visit today with Dr. Farrel Gobble. Patient was advised this was a work in appointment and that Dr. Farrel Gobble will see her but could not guarantee time as patient states she has a 1500 PT appointment.   Pelvic U/S scheduled and patient aware/agreeable to time.  Patient verbalized understanding of the U/S appointment cancellation policy. Advised will need to cancel within 72 business hours (3 business days) or will have $100.00 no show fee placed to account.   PUS scheduled for 04/05/14 (first available with Dr. Farrel Gobble)  Routing to provider for final review. Patient agreeable to disposition. Will close encounter

## 2014-03-28 NOTE — Progress Notes (Signed)
Subjective:     Patient ID: Lisa Whitney, female   DOB: July 12, 1976, 38 y.o.   MRN: [id number]  HPI Comments: Pt here complaining of LLQ pain.  Pt states that her cycles have been mostly regular on metformin BID except for June she believes that she had 3 cycles, beginning, middle and end.  Cycle July 24 seemed normal, the she began cramping and dark blood on 03/25/14 with dark clots until 03/27/14.  Pt states she never has dark/black bleeding.  She reports bilateral left and right sided pain but more on the left.  We had ordered a PUS for LLQ pain in 11/2013 but she did not have it done, she scheduled it this am for next week. Pt has not been sexually active in years. She was started on spironolactone for hair growth. She was switched from adderal to vyvanse   Vaginal Bleeding The patient's primary symptoms include pelvic pain. The patient's pertinent negatives include no vaginal discharge. Pertinent negatives include no chills, constipation, diarrhea, fever, flank pain, hematuria or urgency.     Review of Systems  Constitutional: Negative for fever and chills.  Gastrointestinal: Negative for diarrhea and constipation.  Genitourinary: Positive for vaginal bleeding and pelvic pain. Negative for urgency, hematuria, flank pain, vaginal discharge, vaginal pain and dyspareunia.       Objective:   Physical Exam  Nursing note and vitals reviewed. Constitutional: She is oriented to person, place, and time. She appears well-developed and well-nourished.  Genitourinary:   Pelvic: External genitalia:  no lesions              Urethra:  normal appearing urethra with no masses, tenderness or lesions              Bartholins and Skenes: normal                 Vagina: normal appearing vagina with normal color and scant dark blood              Cervix: normal appearance, no CMT                      Bimanual Exam:  Uterus: normal size, shape, consistency and fundal tenderness, retroverted                               Adnexa: bilateral tenderness, left>right                                      Rectovaginal: no nodularity                                      Anus:  normal sphincter tone, no lesions   Neurological: She is alert and oriented to person, place, and time.       Assessment:     LLQ pain PCOS Abnormal bleeding     Plan:     PUS Discussed trying to control both bleeding, pain and hormones with continuous ocp or progestin IUD.  Possible endometriosis but not diagnosed, recommend treating as such-pt agreeable.  Association of endometriosis and pain reviewed, questions addressed Information given Will discuss further after PUS      22m spent counseling >50% face to face

## 2014-04-05 ENCOUNTER — Ambulatory Visit (INDEPENDENT_AMBULATORY_CARE_PROVIDER_SITE_OTHER): Payer: BC Managed Care – PPO

## 2014-04-05 ENCOUNTER — Ambulatory Visit (INDEPENDENT_AMBULATORY_CARE_PROVIDER_SITE_OTHER): Payer: BC Managed Care – PPO | Admitting: Gynecology

## 2014-04-05 VITALS — BP 100/70 | Resp 18 | Ht 65.5 in | Wt 149.0 lb

## 2014-04-05 DIAGNOSIS — R1032 Left lower quadrant pain: Secondary | ICD-10-CM

## 2014-04-05 DIAGNOSIS — E282 Polycystic ovarian syndrome: Secondary | ICD-10-CM

## 2014-04-05 MED ORDER — ETONOGESTREL-ETHINYL ESTRADIOL 0.12-0.015 MG/24HR VA RING: VAGINAL_RING | VAGINAL | Status: DC

## 2014-04-05 NOTE — Progress Notes (Signed)
     Pt here for PUS for DUB with LLQ pain. Images reviewed with pt: Uterus is normal size and shape and retroverted, EMS 7.1 and trilayered, normal appearing follicle on right, left is normal, no free fluid. D/w pt pain may be related to position of uterus and ovary, we cannot rule out pelvic adhesions-endometriosis without surgery but can treat as such.  We discussed trial of either ocp or skyla IUD.  Method of action of eac hreviewed.  Pt is having issues with acne and hair growth, so ocp would cover more of her issues. Suggest nuvaring to bypass liver P450 with other medications she is on, instructed on use, she will place by the 3rd day of upcoming cycle and will exchange every month instead of having withdraw bleed. Questions addressed. 52m spent counseling, >50% face to face

## 2014-04-05 NOTE — Patient Instructions (Signed)
Place nuvaring first day of next cycle Exchange every month on the date

## 2014-04-15 ENCOUNTER — Telehealth: Payer: Self-pay | Admitting: Gynecology

## 2014-04-15 DIAGNOSIS — R1032 Left lower quadrant pain: Secondary | ICD-10-CM

## 2014-04-15 MED ORDER — ETONOGESTREL-ETHINYL ESTRADIOL 0.12-0.015 MG/24HR VA RING: VAGINAL_RING | VAGINAL | Status: DC

## 2014-04-15 NOTE — Telephone Encounter (Signed)
Rx sent 04/05/14 as sample. Rx sent again today normal. Pt notified.

## 2014-04-15 NOTE — Telephone Encounter (Signed)
Patient was seen 04/05/14 and her Nuvoring was not sent to her pharmacy. Patient needs ASAP.

## 2014-04-21 ENCOUNTER — Telehealth: Payer: Self-pay | Admitting: Gynecology

## 2014-04-21 NOTE — Telephone Encounter (Signed)
Spoke with patient. Advised of message from Dr. Farrel Gobble.  She has started her cycle today. She is out of town and will not be able to pick up rx until 04/24/14. Advised that is okay, suggested back up method as well, patient states she is not sexually active. Patient will call back with any concerns.  Routing to provider for final review. Patient agreeable to disposition. Will close encounter

## 2014-04-21 NOTE — Telephone Encounter (Signed)
Message left to return call to [employer] at [phone].  Called Mobile, no answer, rang and rang, unable to leave message.   Notes from Dr. Farrel Gobble: Nuvaring: She will place by the 3rd day of upcoming cycle and will exchange every month instead of having withdrawal bleeding.

## 2014-04-21 NOTE — Telephone Encounter (Signed)
Pt not sure when she should start using the nuvaring.

## 2014-06-06 ENCOUNTER — Encounter: Payer: Self-pay | Admitting: Gynecology

## 2014-06-13 ENCOUNTER — Telehealth: Payer: Self-pay | Admitting: Nurse Practitioner

## 2014-06-13 NOTE — Telephone Encounter (Signed)
Left message to call Cecilee Rosner at [phone]. 

## 2014-06-13 NOTE — Telephone Encounter (Signed)
Spoke with patient. She is using Nuvaring Continuously per orders from Dr. Farrel GobbleLathrop. Patient reports history of "terrible" cycles and PCOS and possibly endometriosis.  Patient using Nuvaring for September and October, changing ring on the 15th of each month.  Patient states this morning she started spotting and is concerned about spotting. Advised if using continuously, this spotting is normal. Advised patient to keep Nuvaring in place until office visit with Verner Choleborah S. Leonard CNM as patient has concerns she may not be placing nuvaring correctly because she reports a retroverted uterus. Patient is not sexually active. Not using ring for contraception. Patient is scheduled for office visit with Verner Choleborah S. Leonard CNM on 06/16/14. Advised patient if has any concerns prior to appointment to call back. Discussed bleeding emergencies and advised patient to call back or seek immediate medical care if bleeding worsens or soaking through 1 pad/tampon per hour for two hours.   Patient verbalized understanding. Will follow up as scheduled.  Routing to provider for final review. Patient agreeable to disposition. Will close encounter

## 2014-06-13 NOTE — Telephone Encounter (Signed)
Patient requesting to speak with the nurse about "bleeding on NuvaRing." She scheduled an appointment with Ortencia Kick. Leonard, who she wants to see instead of Dr. Farrel GobbleLathrop now, for a consult for 06/16/14 but she also wants to speak with the nurse.

## 2014-06-16 ENCOUNTER — Encounter: Payer: Self-pay | Admitting: Certified Nurse Midwife

## 2014-06-16 ENCOUNTER — Ambulatory Visit (INDEPENDENT_AMBULATORY_CARE_PROVIDER_SITE_OTHER): Payer: BC Managed Care – PPO | Admitting: Certified Nurse Midwife

## 2014-06-16 VITALS — BP 120/80 | HR 80 | Resp 18 | Ht 65.5 in | Wt 154.0 lb

## 2014-06-16 DIAGNOSIS — Z3049 Encounter for surveillance of other contraceptives: Secondary | ICD-10-CM

## 2014-06-16 NOTE — Progress Notes (Signed)
38 y.o. Single Caucasian [id number] here for evaluation of Nuvaring initiated on 04/21/14 for menorrhagia and dysmenorrhea. Patient thought she was supposed to leave in and take out after menses per Dr.Lathrop instructions. Patient still has Nuvaring in but per when she inserted should be removed and new one in today. Patient having no problem with insertion and had no menses first cycle with use. No finishing this one with just normal period and slightly darker blood. No STD concerns not sexually active. Patient has no cramping which was the reason for initiation of. Denies any warning signs or symptoms with use. Happy with choice.Menses duration 4 days with light flow. Patient No other health issues today  O: Healthy female, WD WN Affect: normal orientation X 3  Pelvic exam per request to see in Nuvaring in "right place". External genital area: normal,no lesions Vagina: scant dark blood noted, Nuvaring noted in correct place Cervix, normal, no lesions, blood present from cervix Uterus: anteflexed, non tender,normal Adnexal: normal, no masses or tenderness   A: History of Dysmennorrhea/ menorrhagia Nuvaring working well  P:reassured patient that Nuvaring in proper place. Discussed should take out after 3 completed weeks unless using continously, which was the intent to help with cycle control. Instructed with calendar how to do this and patient also put instructions in phone. Patient will do regular cycle use every 3 months. She will call if any issues.  Continue Nuvaring as prescribed .  Rv aex, prn if problems  15 minutes spent with patient with instruction of how to use Nuvaring appropriately in addition to exam.

## 2014-06-16 NOTE — Patient Instructions (Signed)

## 2014-06-17 NOTE — Progress Notes (Signed)
Reviewed personally.  M. Suzanne Patsye Sullivant, MD.  

## 2014-11-29 ENCOUNTER — Encounter: Payer: Self-pay | Admitting: Certified Nurse Midwife

## 2014-11-29 ENCOUNTER — Ambulatory Visit: Payer: BC Managed Care – PPO | Admitting: Certified Nurse Midwife

## 2014-12-12 ENCOUNTER — Other Ambulatory Visit: Payer: Self-pay | Admitting: *Deleted

## 2014-12-12 NOTE — Telephone Encounter (Signed)
Medication refill request: metformin 500 mg  Last AEX:  11/24/13 TL Next AEX: 12/22/14 DL Last MMG (if hormonal medication request): 11/11/12 [id number]:Neg Refill authorized: 01/10/14 #60 w/ 10 refills. Today #60/0R?

## 2014-12-13 MED ORDER — METFORMIN HCL 500 MG PO TABS: 500.0000 mg | ORAL_TABLET | Freq: Two times a day (BID) | ORAL | Status: DC

## 2014-12-22 ENCOUNTER — Encounter: Payer: Self-pay | Admitting: Certified Nurse Midwife

## 2014-12-22 ENCOUNTER — Ambulatory Visit (INDEPENDENT_AMBULATORY_CARE_PROVIDER_SITE_OTHER): Payer: BLUE CROSS/BLUE SHIELD | Admitting: Certified Nurse Midwife

## 2014-12-22 VITALS — BP 92/60 | HR 88 | Resp 18 | Ht 64.75 in | Wt 162.0 lb

## 2014-12-22 DIAGNOSIS — Z01419 Encounter for gynecological examination (general) (routine) without abnormal findings: Secondary | ICD-10-CM | POA: Diagnosis not present

## 2014-12-22 DIAGNOSIS — Z3049 Encounter for surveillance of other contraceptives: Secondary | ICD-10-CM | POA: Diagnosis not present

## 2014-12-22 DIAGNOSIS — Z0000 Encounter for general adult medical examination without abnormal findings: Secondary | ICD-10-CM

## 2014-12-22 DIAGNOSIS — Z124 Encounter for screening for malignant neoplasm of cervix: Secondary | ICD-10-CM | POA: Diagnosis not present

## 2014-12-22 LAB — POCT URINALYSIS DIPSTICK
BILIRUBIN UA: NEGATIVE
Blood, UA: NEGATIVE
GLUCOSE UA: NEGATIVE
Ketones, UA: NEGATIVE
Leukocytes, UA: NEGATIVE
Nitrite, UA: NEGATIVE
PH UA: 7
Protein, UA: NEGATIVE
Urobilinogen, UA: NEGATIVE

## 2014-12-22 MED ORDER — ETONOGESTREL-ETHINYL ESTRADIOL 0.12-0.015 MG/24HR VA RING: VAGINAL_RING | VAGINAL | Status: DC

## 2014-12-22 NOTE — Progress Notes (Signed)
Reviewed personally.  M. Suzanne Tangy Drozdowski, MD.  

## 2014-12-22 NOTE — Progress Notes (Signed)
39 y.o. G1P0010 Single  Caucasian Fe here for annual exam. Periods normal now with Nuvaring. Not sexually active. Happy with Nuvaring choice. No STD screening. Sees Dr. Talmage NapBalan for Hypothyroid and PCOS management. Dr Lafayette Dragonarr manages other medications for Bipolar. Patient working on weight loss and diet change with Dr.Balan. Nephew is back in hospital with port a cath problems again, patient helping with brother's other children. 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 No other health issues today.  Patient's last menstrual period was 12/03/2014.          Sexually active: No. The current method of family planning is NuvaRing vaginal inserts.    Exercising: Yes.    Walking  Smoker:  Using nicotine vaper to smoke occasionally  Health Maintenance: Pap: 05/2012 Neg. HR HPV:neg MMG:  11/2012 BIRADS1:neg Self Breast Exam: No.  TDaP: 2010 Labs: Endocrinology  Dr. Talmage NapBalan.    reports that she has quit smoking. She has never used smokeless tobacco. She reports that she drinks about 1.2 oz of alcohol per week. She reports that she does not use illicit drugs.  Past Medical History  Diagnosis Date  . Anxiety   . Depression   . Anemia   . Thyroid disease   . Bipolar affective disorder   . PCOS (polycystic ovarian syndrome)   . ADHD (attention deficit hyperactivity disorder) 1990  . Carpal tunnel syndrome, bilateral   . Vertigo     Past Surgical History  Procedure Laterality Date  . Carpal tunnel release  2010    Left only  . Eye surgery      Lasic  . Co2 laser application  2014  . Gum surgery  ~2008    Current Outpatient Prescriptions  Medication Sig Dispense Refill  . ALPRAZolam (XANAX) 1 MG tablet Take 1 mg by mouth at bedtime as needed for sleep.    [name]. amphetamine-dextroamphetamine (ADDERALL) 20 MG tablet Take 2 tablets by mouth daily.  0  . etonogestrel-ethinyl estradiol (NUVARING) 0.12-0.015 MG/24HR vaginal ring Insert vaginally and leave in place for 4 consecutive weeks, then remove for 3d 3 each 3  .  levothyroxine (SYNTHROID, LEVOTHROID) 75 MCG tablet Take 75 mcg by mouth daily before breakfast.    . metFORMIN (GLUCOPHAGE) 500 MG tablet Take 1 tablet (500 mg total) by mouth 2 (two) times daily with a meal. 60 tablet 0  . Multiple Vitamin (MULTIVITAMIN) tablet Take 1 tablet by mouth daily.    . mupirocin ointment (BACTROBAN) 2 % Place 1 application into the nose 2 (two) times daily.    . Omega-3 Fatty Acids (OMEGA 3 PO) Take by mouth.    . Probiotic Product (PROBIOTIC DAILY PO) Take by mouth daily.    [name]. spironolactone (ALDACTONE) 50 MG tablet Take 50 mg by mouth daily.    [name]. venlafaxine XR (EFFEXOR-XR) 75 MG 24 hr capsule Take 75 mg by mouth daily with breakfast.    . Vitamin D, Ergocalciferol, (DRISDOL) 50000 UNITS CAPS capsule Take 1 capsule by mouth once a week.    . SOOLANTRA 1 % CREA   0   No current facility-administered medications for this visit.    Family History  Problem Relation Age of Onset  . Cancer Mother   . Hypertension Father   . Hyperlipidemia Father   . Cancer Maternal Grandmother   . Cancer Paternal Grandmother   . Heart disease Paternal Grandmother   . Diabetes Paternal Grandmother     ROS:  Pertinent items are noted in HPI.  Otherwise, a comprehensive ROS  was negative.  Exam:   BP 92/60 mmHg  Pulse 88  Resp 18  Ht 5' 4.75" (1.645 m)  Wt 162 lb (73.483 kg)  BMI 27.16 kg/m2  LMP 12/03/2014 Height: 5' 4.75" (164.5 cm) Ht Readings from Last 3 Encounters:  12/22/14 5' 4.75" (1.645 m)  06/16/14 5' 5.5" (1.664 m)  04/05/14 5' 5.5" (1.664 m)    General appearance: alert, cooperative and appears stated age Head: Normocephalic, without obvious abnormality, atraumatic Neck: no adenopathy, supple, symmetrical, trachea midline and thyroid normal to inspection and palpation Lungs: clear to auscultation bilaterally Breasts: normal appearance, no masses or tenderness, No nipple retraction or dimpling, No nipple discharge or bleeding, No axillary or supraclavicular  adenopathy Heart: regular rate and rhythm Abdomen: soft, non-tender; no masses,  no organomegaly Extremities: extremities normal, atraumatic, no cyanosis or edema Skin: Skin color, texture, turgor normal. No rashes or lesions Lymph nodes: Cervical, supraclavicular, and axillary nodes normal. No abnormal inguinal nodes palpated Neurologic: Grossly normal   Pelvic: External genitalia:  no lesions              Urethra:  normal appearing urethra with no masses, tenderness or lesions              Bartholin's and Skene's: normal                 Vagina: normal appearing vagina with normal color and discharge, no lesions              Cervix: normal, non tender, no lesions              Pap taken: Yes.   Bimanual Exam:  Uterus:  normal size, contour, position, consistency, mobility, non-tender and anteverted              Adnexa: normal adnexa and no mass, fullness, tenderness               Rectovaginal: Confirms               Anus:  normal sphincter tone, no lesions  Chaperone present: Yes  A:  Well Woman with normal exam  Contraception  Nuvaring desired  History of PCOS with Endocrine management of medications  Hypothyroid with Endocrine management  Bipolar with Psychiatrist management of medications.  P:   Reviewed health and wellness pertinent to exam  Rx Nuvaring see order  Continue follow with MD's with other health issues as indicated  Pap smear taken with HPV reflex   counseled on breast self exam, STD prevention, HIV risk factors and prevention, adequate intake of calcium and vitamin D, diet and exercise  return annually or prn  An After Visit Summary was printed and given to the patient.

## 2014-12-22 NOTE — Patient Instructions (Signed)

## 2014-12-24 LAB — IPS PAP TEST WITH REFLEX TO HPV

## 2015-01-13 ENCOUNTER — Other Ambulatory Visit: Payer: Self-pay | Admitting: *Deleted

## 2015-01-13 MED ORDER — METFORMIN HCL 500 MG PO TABS: 500.0000 mg | ORAL_TABLET | Freq: Two times a day (BID) | ORAL | Status: DC

## 2015-01-13 NOTE — Telephone Encounter (Signed)
She is requesting a 1 month refill this time and will have it transferred to her Endocrine provider.  EH

## 2015-01-13 NOTE — Telephone Encounter (Signed)
Medication refill request: metformin 500mg  Last AEX:  12-22-14 Next AEX: no AEX scheduled  Last MMG (if hormonal medication request): 11-11-12 WNL  Refill authorized: please advise

## 2015-01-13 NOTE — Telephone Encounter (Signed)
Did we get her glucose screening results from endocrine for refill

## 2015-01-13 NOTE — Telephone Encounter (Signed)
rx sent no refills

## 2015-02-10 ENCOUNTER — Other Ambulatory Visit: Payer: Self-pay | Admitting: Certified Nurse Midwife

## 2015-02-10 NOTE — Telephone Encounter (Signed)
Medication refill request: Metformin 500 mg Last AEX: 12/22/14 with DL Next AEX: No AEX scheduled for 2017 Last MMG (if hormonal medication request): n/a Refill authorized: Please advise.

## 2015-03-15 ENCOUNTER — Other Ambulatory Visit: Payer: Self-pay | Admitting: Certified Nurse Midwife

## 2015-03-15 NOTE — Telephone Encounter (Signed)
Medication refill request: metformin  Last AEX:  12-22-14 Next AEX: not scheduled Last MMG (if hormonal medication request): N/A Refill authorized: please advise

## 2015-03-15 NOTE — Telephone Encounter (Signed)
Patient was to obtain medication from endocrine who checked BS for her. If we have the results this can be filled here, but have some follow up if we treat.

## 2015-03-15 NOTE — Telephone Encounter (Signed)
Must have BS results to renew this RX. She was to have at endocrine appt.

## 2015-03-16 NOTE — Telephone Encounter (Signed)
Medication refill request: Metformin Last AEX:  12-12-14  Next AEX: not scheduled  Last MMG (if hormonal medication request): N/A Refill authorized: please advise

## 2015-03-16 NOTE — Telephone Encounter (Signed)
Left Voicemail to call back.  - We Need records from Endocrinology or have her come in for labs to refill Rx.

## 2015-03-16 NOTE — Telephone Encounter (Signed)
Please see note from previous call regarding needs HGB A1-c check or copy from who checked it last to continue

## 2015-07-11 DIAGNOSIS — E669 Obesity, unspecified: Secondary | ICD-10-CM | POA: Insufficient documentation

## 2016-01-17 ENCOUNTER — Other Ambulatory Visit: Payer: Self-pay | Admitting: Certified Nurse Midwife

## 2016-01-17 NOTE — Telephone Encounter (Signed)
Needs aex before refills

## 2016-01-17 NOTE — Telephone Encounter (Signed)
Medication refill request: Nuvaring 0.12-0.015 MG/24HR Last AEX:  12/22/14 DL Next AEX: none scheduled Last MMG (if hormonal medication request): n/a Refill authorized: 12/22/14 #3each w/4refills; today please advise

## 2016-01-18 NOTE — Telephone Encounter (Signed)
Tried calling patient to schedule AEX, no answer, left message to call office back.

## 2016-01-18 NOTE — Telephone Encounter (Signed)
Patient is scheduled for 01/23/16 for annual.

## 2016-01-23 ENCOUNTER — Ambulatory Visit (INDEPENDENT_AMBULATORY_CARE_PROVIDER_SITE_OTHER): Payer: BLUE CROSS/BLUE SHIELD | Admitting: Certified Nurse Midwife

## 2016-01-23 ENCOUNTER — Encounter: Payer: Self-pay | Admitting: Certified Nurse Midwife

## 2016-01-23 VITALS — BP 104/70 | HR 72 | Resp 16 | Ht 65.0 in

## 2016-01-23 DIAGNOSIS — Z01419 Encounter for gynecological examination (general) (routine) without abnormal findings: Secondary | ICD-10-CM | POA: Diagnosis not present

## 2016-01-23 DIAGNOSIS — K625 Hemorrhage of anus and rectum: Secondary | ICD-10-CM | POA: Diagnosis not present

## 2016-01-23 DIAGNOSIS — Z0000 Encounter for general adult medical examination without abnormal findings: Secondary | ICD-10-CM | POA: Diagnosis not present

## 2016-01-23 LAB — POCT URINALYSIS DIPSTICK
Bilirubin, UA: NEGATIVE
Glucose, UA: NEGATIVE
Ketones, UA: NEGATIVE
Leukocytes, UA: NEGATIVE
NITRITE UA: NEGATIVE
PH UA: 5
PROTEIN UA: NEGATIVE
RBC UA: NEGATIVE
UROBILINOGEN UA: NEGATIVE

## 2016-01-23 NOTE — Progress Notes (Signed)
40 y.o. G1P0010 Single  Caucasian Fe here for annual exam. Periods so much better with Nuvaring use. Dysmenorrhea has definitely decreased. Patient has noted rectal bleeding without pain. History of hemorrhoids in the past.  Has noted it much more frequent this year. Denies black tarry stools. Sees Dr. Timothy Lasso yearly for labs and aex. Went on weight loss plan again, weight at home 165, lost down to 155 from 190 with [facility] weight loss. Plans to continue exercise. Discussed vaping now with .6% Nicotine previous flavored. Sees Dr. Talmage Nap for Hypothyroid management. Sees Dr Evelene Croon for medication management for anxietydepression.  No other health concerns today. Planning time with family soon.  Patient's last menstrual period was 01/13/2016.          Sexually active: No.  The current method of family planning is NuvaRing vaginal inserts.    Exercising: No.  exercise Smoker:  no  Health Maintenance: Pap:  12-22-14 neg MMG:  2014 birads 1:neg Colonoscopy:  none BMD:   none TDaP:  2010 Shingles: no Pneumonia: no Hep C and HIV: not done Labs: poct urine-neg Self breast exam: done occ   reports that she has been smoking.  She has never used smokeless tobacco. She reports that she drinks about 1.2 oz of alcohol per week. She reports that she does not use illicit drugs.  Past Medical History  Diagnosis Date  . Anxiety   . Depression   . Anemia   . Thyroid disease   . Bipolar affective disorder (HCC)   . PCOS (polycystic ovarian syndrome)   . ADHD (attention deficit hyperactivity disorder) 1990  . Carpal tunnel syndrome, bilateral   . Vertigo     Past Surgical History  Procedure Laterality Date  . Carpal tunnel release  2010    Left only  . Eye surgery      Lasic  . Co2 laser application  2014  . Gum surgery  ~2008    Current Outpatient Prescriptions  Medication Sig Dispense Refill  . ALPRAZolam (XANAX) 1 MG tablet Take 1 mg by mouth at bedtime as needed for sleep.    . Cholecalciferol  (VITAMIN D PO) Take by mouth daily.    [name] levothyroxine (SYNTHROID, LEVOTHROID) 75 MCG tablet Take 75 mcg by mouth daily before breakfast.    . lisdexamfetamine (VYVANSE) 70 MG capsule Take by mouth.    . metFORMIN (GLUCOPHAGE) 500 MG tablet TAKE ONE TABLET TWICE DAILY WITH A MEAL 60 tablet 0  . Multiple Vitamin (MULTIVITAMIN) tablet Take 1 tablet by mouth daily.    [name] NUVARING 0.12-0.015 MG/24HR vaginal ring INSERT VAGINALLY AND LEAVE IN PLACE FOR 4 CONSECUTIVE WEEKS THEN REMOVE FOR 3 DAYS 1 each 0  . Probiotic Product (PROBIOTIC DAILY PO) Take by mouth as needed.     [name] spironolactone (ALDACTONE) 50 MG tablet Take 50 mg by mouth daily.    [name] venlafaxine XR (EFFEXOR-XR) 75 MG 24 hr capsule Take 75 mg by mouth daily with breakfast.    . Vitamin D, Ergocalciferol, (DRISDOL) 50000 UNITS CAPS capsule Take 1 capsule by mouth once a week.     No current facility-administered medications for this visit.    Family History  Problem Relation Age of Onset  . Cancer Mother   . Hypertension Father   . Hyperlipidemia Father   . Cancer Maternal Grandmother   . Cancer Paternal Grandmother   . Heart disease Paternal Grandmother   . Diabetes Paternal Grandmother     ROS:  Pertinent items are  noted in HPI.  Otherwise, a comprehensive ROS was negative.  Exam:   BP 104/70 mmHg  Pulse 72  Resp 16  Ht  (1.651 m)  Wt   LMP 01/13/2016 Height:  (165.1 cm) Ht Readings from Last 3 Encounters:  01/23/16  (1.651 m)  12/22/14 5' 4.75" (1.645 m)  06/16/14 5' 5.5" (1.664 m)    General appearance: alert, cooperative and appears stated age Head: Normocephalic, without obvious abnormality, atraumatic Neck: no adenopathy, supple, symmetrical, trachea midline and thyroid normal to inspection and palpation Lungs: clear to auscultation bilaterally Breasts: normal appearance, no masses or tenderness, No nipple retraction or dimpling, No nipple discharge or bleeding, No axillary or supraclavicular  adenopathy Heart: regular rate and rhythm Abdomen: soft, non-tender; no masses,  no organomegaly Extremities: extremities normal, atraumatic, no cyanosis or edema Skin: Skin color, texture, turgor normal. No rashes or lesions Lymph nodes: Cervical, supraclavicular, and axillary nodes normal. No abnormal inguinal nodes palpated Neurologic: Grossly normal   Pelvic: External genitalia:  no lesions              Urethra:  normal appearing urethra with no masses, tenderness or lesions              Bartholin's and Skene's: normal                 Vagina: normal appearing vagina with normal color and discharge, no lesions              Cervix: no cervical motion tenderness, no lesions and normal              Pap taken: No. Bimanual Exam:  Uterus:  normal size, contour, position, consistency, mobility, non-tender and anteverted              Adnexa: normal adnexa and no mass, fullness, tenderness               Rectovaginal: Confirms               Anus:  normal sphincter tone, no lesions, no hemorrhoids or fissure noted or blood.  Chaperone present: yes  A:  Well Woman with normal exam  Contraception Nuvaring desired for dysmenorrhea(not sexually active)  Vaping nicotine now  Rectal bleeding increase, no hemorrhoids noted on exam( last occurrence one month ago.  Anxiety/depression/hypothyroid, Bipolar with MD management    P:   Reviewed health and wellness pertinent to exam  Discussed due to nicotine use will need to stop Nuvaring, due to increase risk of estrogen related DVT or stroke. Patient very upset and will stop vaping nicotine and wean to flavored only as she has done before. Dysmenorrhea so much better with Nuvaring,"Please let wean off and stay on Nuvaring". Discussed with patient she will need to do this before another refill on Nuvaring is given. Patient promises to do this and does not want a complication. No refill given. Patient will call when she has totally weaned off and ready to  start Nuvaring again.  Discussed due to increase in amount of rectal bleeding she relates, she should be seen by GI. Patient would like referral. Will be called with referral to Dr. Loreta Ave.Start on colace daily to keep stool soft. Discussed increasing fiber in diet and water daily. Warning signs of rectal bleeding given and need to advise.  Continue follow up with MD as indicated.  Pap smear as above not taken   counseled on breast self exam, STD prevention, HIV risk factors  and prevention, adequate intake of calcium and vitamin D, diet and exercise  return annually or prn  An After Visit Summary was printed and given to the patient.

## 2016-01-23 NOTE — Patient Instructions (Signed)

## 2016-01-24 NOTE — Progress Notes (Signed)
Agree with plan regarding vaping with nicotine and Nuva ring.  Reviewed personally.  Lum KeasM. Suzanne Bud Kaeser, MD.

## 2016-02-15 ENCOUNTER — Other Ambulatory Visit: Payer: Self-pay | Admitting: Certified Nurse Midwife

## 2016-02-15 ENCOUNTER — Telehealth: Payer: Self-pay | Admitting: Certified Nurse Midwife

## 2016-02-15 DIAGNOSIS — Z30019 Encounter for initial prescription of contraceptives, unspecified: Secondary | ICD-10-CM

## 2016-02-15 MED ORDER — ETONOGESTREL-ETHINYL ESTRADIOL 0.12-0.015 MG/24HR VA RING: 1.0000 | VAGINAL_RING | VAGINAL | Status: DC

## 2016-02-15 NOTE — Telephone Encounter (Signed)
Patient was seen for her annual exam on 01/23/2016 with Leota Sauerseborah Leonard CNM. At that time she was on the Nuvaring, but was vaping Nicotine. Was advised she would need to stop vaping nicotine before a refill on Nuvaring could be given. Patient is calling to report she has stopped using nicotine and would like a refill on her Nuvaring.  Routing to PepsiCoDeborah Leonard CNM for review and advise.

## 2016-02-15 NOTE — Telephone Encounter (Signed)
Patient wanted to let Lisa Whitney know that she has stopped smoking and would like a prescription for the nuvaring.

## 2016-02-15 NOTE — Telephone Encounter (Signed)
Spoke with patient. Advised of message as seen below from PepsiCoDeborah Leonard CNM. She is agreeable and verbalizes understanding. Aware if she starts vaping Nicotine again she will need to discontinue the use of her Nuvaring due to risk for DVT and stroke. She is agreeable.  Routing to provider for final review. Patient agreeable to disposition. Will close encounter.

## 2016-02-15 NOTE — Telephone Encounter (Signed)
Ok to refill Nuvaring, but she needs to know that if she uses Nuvaring and is vaping Nicotine will increase her risk of DVT or stroke. We discussed previously, but needs reminder. Order placed.

## 2017-01-23 ENCOUNTER — Ambulatory Visit: Payer: BLUE CROSS/BLUE SHIELD | Admitting: Certified Nurse Midwife

## 2017-01-29 ENCOUNTER — Ambulatory Visit: Payer: BLUE CROSS/BLUE SHIELD | Admitting: Certified Nurse Midwife

## 2017-09-01 ENCOUNTER — Encounter: Payer: Self-pay | Admitting: Certified Nurse Midwife

## 2017-12-10 DIAGNOSIS — G5600 Carpal tunnel syndrome, unspecified upper limb: Secondary | ICD-10-CM | POA: Insufficient documentation

## 2017-12-10 DIAGNOSIS — M79641 Pain in right hand: Secondary | ICD-10-CM | POA: Insufficient documentation

## 2018-04-29 ENCOUNTER — Other Ambulatory Visit: Payer: Self-pay | Admitting: Endocrinology

## 2018-04-29 DIAGNOSIS — E229 Hyperfunction of pituitary gland, unspecified: Secondary | ICD-10-CM

## 2018-05-06 ENCOUNTER — Ambulatory Visit
Admission: RE | Admit: 2018-05-06 | Discharge: 2018-05-06 | Disposition: A | Discharge status: Home / Self Care | Payer: BLUE CROSS/BLUE SHIELD | Source: Ambulatory Visit | Attending: Endocrinology | Admitting: Endocrinology

## 2018-05-06 DIAGNOSIS — E229 Hyperfunction of pituitary gland, unspecified: Secondary | ICD-10-CM

## 2018-05-06 MED ORDER — GADOBENATE DIMEGLUMINE 529 MG/ML IV SOLN
10.0000 mL | Freq: Once | INTRAVENOUS | Status: AC | PRN
  Administered 2018-05-06: 10 mL via INTRAVENOUS

## 2018-05-15 DIAGNOSIS — E270 Other adrenocortical overactivity: Secondary | ICD-10-CM | POA: Insufficient documentation

## 2018-06-15 DIAGNOSIS — L680 Hirsutism: Secondary | ICD-10-CM | POA: Insufficient documentation

## 2018-06-26 DIAGNOSIS — E039 Hypothyroidism, unspecified: Secondary | ICD-10-CM | POA: Insufficient documentation

## 2018-06-26 DIAGNOSIS — G479 Sleep disorder, unspecified: Secondary | ICD-10-CM | POA: Insufficient documentation

## 2018-11-10 ENCOUNTER — Telehealth: Payer: Self-pay | Admitting: Neurology

## 2018-11-10 NOTE — Telephone Encounter (Signed)
Due to current COVID 19 pandemic, our office is severely reducing in office visits for at least the next 2 weeks, in order to minimize the risk to our patients and healthcare providers. Our staff will contact you for next steps. I left a message for the pt to call me back for me to go over what a virtual visit is and see if the pt is capable of doing a virtual visit . Once pt calls back if she wishes to do a telephone visit I will go over the consent form with the pt.  ° °

## 2018-11-30 ENCOUNTER — Telehealth: Payer: Self-pay | Admitting: Neurology

## 2018-11-30 NOTE — Telephone Encounter (Signed)
Due to current COVID 19 pandemic, our office is severely reducing in office visits, in order to minimize the risk to our patients and healthcare providers.  Pt understands that although there may be some limitations with this type of visit, we will take all precautions to reduce any security or privacy concerns.  Pt understands that this will be treated like an in office visit and we will file with pt's insurance, and there may be a patient responsible charge related to this service. Pt's email is [email or web address]. Pt understands that the [employer] webex software must be downloaded and operational on the device pt plans to use for the visit. Pt understands that the nurse will be calling to go over pt's chart.

## 2018-12-02 ENCOUNTER — Encounter: Payer: Self-pay | Admitting: Neurology

## 2018-12-02 NOTE — Addendum Note (Signed)
Addended by: Judi Cong on: 12/02/2018 12:17 PM   Modules accepted: Orders

## 2018-12-02 NOTE — Telephone Encounter (Signed)
Called the patient to review their chart and made sure that everything was up to date. Pt informed of [email or web address] and Instructed to make sure they hold on to the e-mail for the upcoming appointment as it is necessary to access their appointment. Instructed the patient that apx 30 min prior to the appointment the front staff will contact them to make sure they are ready to go for their appointment in case there is any need for troubleshooting it can be completed prior to the appointment time. Pt verbalized understanding. Patient also instructed to completed the sleep scale and reply with answers to that and neck circumference measurements prior to the appointment time.

## 2018-12-07 ENCOUNTER — Ambulatory Visit: Payer: BLUE CROSS/BLUE SHIELD | Admitting: Neurology

## 2018-12-07 ENCOUNTER — Other Ambulatory Visit: Payer: Self-pay

## 2018-12-07 ENCOUNTER — Telehealth: Payer: Self-pay | Admitting: Neurology

## 2018-12-07 NOTE — Telephone Encounter (Signed)
Called patient back and LVM to reschedule her appointment. Included my name and extension, as well as work hours.

## 2018-12-07 NOTE — Telephone Encounter (Signed)
Patient needs to be rescheduled as well as probably assist her in trouble shooting. Pt was on doxyme but didn't allow access to her camera and video. May need a walk through before apt. We need to make her apt the last of the day. It can be 11 am slot or 3:30 slot but she will need most likely more then 30 min.

## 2018-12-07 NOTE — Telephone Encounter (Signed)
Pt called stating that there was technical difficulty on her Virtual Visit and was wondering if she is needing to r/s her appt or what the next step was.

## 2018-12-07 NOTE — Progress Notes (Signed)
Virtual Visit via Video Note  Sleep Clinic Consultation   The  primary neurologist is Dr Terrace Arabia.  PCP is Dr Timothy Lasso.  Psychiatrist is Dr. Evelene Croon.   I failed to connect with the patient, please let her reschedule.    The patient was referred with a very recent sleep study [place] - I do not accept patient with recent sleep studies ( within 3 years ) unless they have moved from another community.

## 2018-12-08 NOTE — Telephone Encounter (Signed)
Called the patient to advise her that I had a opening this morning to reschedule the pt since her video visit didn't work. There was no answer, LVM to Advise the pt to call back and reschedule. We need to get her scheduled in a 11 am slot or 3:30 pm slot for her appt to allow time. Advised the patient in voicemail that we can also set up a practice run to troubleshoot before the apt.

## 2018-12-09 ENCOUNTER — Encounter: Payer: Self-pay | Admitting: Neurology

## 2018-12-29 ENCOUNTER — Telehealth: Payer: Self-pay | Admitting: Neurology

## 2018-12-29 NOTE — Telephone Encounter (Signed)
Patient gave consent for VV on the phone. Pt understands that although there may be some limitations with this type of visit, we will take all precautions to reduce any security or privacy concerns.  Pt understands that this will be treated like an in office visit and we will file with pt's insurance, and there may be a patient responsible charge related to this service. Sent email with provider's link to [email or web address]

## 2019-01-04 ENCOUNTER — Other Ambulatory Visit: Payer: Self-pay | Admitting: Internal Medicine

## 2019-01-04 DIAGNOSIS — R109 Unspecified abdominal pain: Secondary | ICD-10-CM

## 2019-01-14 NOTE — Telephone Encounter (Signed)
Called pt & LVM asking for call back to update her chart before VV on Tues 6/16. Left office number in message.

## 2019-01-18 ENCOUNTER — Ambulatory Visit
Admission: RE | Admit: 2019-01-18 | Discharge: 2019-01-18 | Disposition: A | Discharge status: Home / Self Care | Payer: BC Managed Care – PPO | Source: Ambulatory Visit | Attending: Internal Medicine | Admitting: Internal Medicine

## 2019-01-18 ENCOUNTER — Encounter: Payer: Self-pay | Admitting: *Deleted

## 2019-01-18 DIAGNOSIS — R109 Unspecified abdominal pain: Secondary | ICD-10-CM

## 2019-01-18 NOTE — Telephone Encounter (Addendum)
Pt returned my call. She confirmed name & dob, updated part of chart before patient had to go to another appt. She will call back to finish. Still need to review medications, allergies, social history. Pt will setup mychart so she can send her written information and the [facility] report for her 05/2018 MRI for doxy appointment tomorrow. I confirmed her cell number and sent the mychart setup information to her phone. She also asked for the doxy link to be sent again to her email. Email confirmed. Her questions were answered.  Doxy link sent to [email or web address].

## 2019-01-19 ENCOUNTER — Ambulatory Visit: Payer: BLUE CROSS/BLUE SHIELD | Admitting: Neurology

## 2019-01-19 ENCOUNTER — Telehealth: Payer: Self-pay | Admitting: Neurology

## 2019-01-19 NOTE — Telephone Encounter (Signed)
Pt called and stated this morning she started vomiting. Stated she has her typical headache behind R eye, feeling vertigo come on as "wave over my body", says her eyes are starting to cross, sees prisms, says her cognition is affected. When I asked pt about going to ED for these symptoms or calling 911 for her, she declined, expressed no concerns, and stated "I know what this is". She stated she was going to take meclizine and would then be too tired for her doxy appt at 11 AM. Also stated her mother was coming to check on her. She r/s for an in-office visit tomorrow at 1:30 pm. Pt passed COVID19 screening questions. She knows to arrive at 1:00 and to bring a mask. She also understands to drive-up for check-in. Pt was advised that if she develops any new symptoms, has acute worsening of vision, speech, unilateral numbness, unilateral weakness, etc to call 911 and get to a hospital right away. Pt verbalized understanding and appreciation.

## 2019-01-19 NOTE — Telephone Encounter (Signed)
error 

## 2019-01-20 ENCOUNTER — Ambulatory Visit: Payer: Self-pay | Admitting: Neurology

## 2019-01-20 ENCOUNTER — Telehealth: Payer: Self-pay | Admitting: Neurology

## 2019-01-20 NOTE — Telephone Encounter (Signed)
Pt called stating she is still feeling to sick to come in for today's visit. Pt would like RN to call her when available.

## 2019-01-20 NOTE — Telephone Encounter (Signed)
I called the pt back and LVM asking for call back with office number.   When pt calls back, she will need to be advised to please contact PCP, go to urgent care, ED if needed. Pt has never been seen here before. We are happy to r/s her appt for next available when she is better but if she is sick and wants to discuss it then she will need to call physicians she is established with or go to urgent care or ED if situation is urgent.

## 2019-01-28 NOTE — Telephone Encounter (Signed)
Pt is calling in requesting a call back , appt r/s for July 22 at 8am but pt wants a sooner appt

## 2019-02-01 NOTE — Telephone Encounter (Signed)
That's fine, looks like I have an opening Thursday where the emg/ncs was cancelled if she wants that thanks

## 2019-02-01 NOTE — Telephone Encounter (Signed)
Opening no longer there. I spoke with pt and scheduled her for next Tues 7/7 @ 9:00 arival 15-30 minutes prior. Pt aware to bring mask. She denies having tested positive for COVID19 or any known exposure. She verbalized appreciation for the call.

## 2019-02-09 ENCOUNTER — Encounter: Payer: Self-pay | Admitting: Neurology

## 2019-02-09 ENCOUNTER — Other Ambulatory Visit: Payer: Self-pay

## 2019-02-09 ENCOUNTER — Ambulatory Visit (INDEPENDENT_AMBULATORY_CARE_PROVIDER_SITE_OTHER): Payer: BC Managed Care – PPO | Admitting: Neurology

## 2019-02-09 VITALS — BP 108/77 | HR 93 | Temp 97.1°F | Ht 65.0 in | Wt 200.0 lb

## 2019-02-09 DIAGNOSIS — G43709 Chronic migraine without aura, not intractable, without status migrainosus: Secondary | ICD-10-CM | POA: Diagnosis not present

## 2019-02-09 DIAGNOSIS — G4733 Obstructive sleep apnea (adult) (pediatric): Secondary | ICD-10-CM | POA: Diagnosis not present

## 2019-02-09 MED ORDER — AJOVY 225 MG/1.5ML ~~LOC~~ SOAJ: 225.0000 mg | SUBCUTANEOUS | 11 refills | Status: DC

## 2019-02-09 NOTE — Patient Instructions (Signed)
Start Ajovy Start cpap, see Dr. Milas Kocher injection What is this medicine? FREMANEZUMAB (fre ma NEZ ue mab) is used to prevent migraine headaches. This medicine may be used for other purposes; ask your health care provider or pharmacist if you have questions. COMMON BRAND NAME(S): AJOVY What should I tell my health care provider before I take this medicine? They need to know if you have any of these conditions:  an unusual or allergic reaction to fremanezumab, other medicines, foods, dyes, or preservatives  pregnant or trying to get pregnant  breast-feeding How should I use this medicine? This medicine is for injection under the skin. You will be taught how to prepare and give this medicine. Use exactly as directed. Take your medicine at regular intervals. Do not take your medicine more often than directed. It is important that you put your used needles and syringes in a special sharps container. Do not put them in a trash can. If you do not have a sharps container, call your pharmacist or healthcare provider to get one. Talk to your pediatrician regarding the use of this medicine in children. Special care may be needed. Overdosage: If you think you have taken too much of this medicine contact a poison control center or emergency room at once. NOTE: This medicine is only for you. Do not share this medicine with others. What if I miss a dose? If you miss a dose, take it as soon as you can. If it is almost time for your next dose, take only that dose. Do not take double or extra doses. What may interact with this medicine? Interactions are not expected. This list may not describe all possible interactions. Give your health care provider a list of all the medicines, herbs, non-prescription drugs, or dietary supplements you use. Also tell them if you smoke, drink alcohol, or use illegal drugs. Some items may interact with your medicine. What should I watch for while using this  medicine? Tell your doctor or healthcare professional if your symptoms do not start to get better or if they get worse. What side effects may I notice from receiving this medicine? Side effects that you should report to your doctor or health care professional as soon as possible:  allergic reactions like skin rash, itching or hives, swelling of the face, lips, or tongue Side effects that usually do not require medical attention (report these to your doctor or health care professional if they continue or are bothersome):  pain, redness, or irritation at site where injected This list may not describe all possible side effects. Call your doctor for medical advice about side effects. You may report side effects to FDA at 1-800-FDA-1088. Where should I keep my medicine? Keep out of the reach of children. You will be instructed on how to store this medicine. Throw away any unused medicine after the expiration date on the label. NOTE: This sheet is a summary. It may not cover all possible information. If you have questions about this medicine, talk to your doctor, pharmacist, or health care provider.  2020 Elsevier/Gold Standard (2017-04-21 17:22:56)

## 2019-02-09 NOTE — Progress Notes (Signed)
[facility]    Provider:  Dr Lucia GaskinsAhern Requesting Provider: Creola Cornusso, John, MD Primary Care Provider:  Milagros EvenerKaur,Rupinder MD  CC:  Headaches  HPI:  Lisa Whitney is a 43 y.o. female here as requested by Dr. Evelene CroonKaur for Headaches.  Past medical history obesity, Cushing syndrome, migraines and vertigo, hypercortisolemia, mood disorders, insulin resistance, OSA (not on cpap), . She has had headaches for 10 years, she is not using her cpap. She has tension headaches. She has had a dull headache, moreso in the last 6-8 months, she has a headache behind the right eye and it gets lazy. She has flushing, nausea, dizziness, right eye droopy, she sleeps during the day. I encouraged her, she has them 2-3 times a week, bad headache behind the right eye, not always in themorning, sometimes at night. She has migraines but these are not migrainous, she has to bein the dark. Nothing works. No focal deficits, not positional, excedrinmigraine helps 2x a week. She has 15 headache days a month, 8 are migrainous, no aura, no medication overuse, no positional qulity, no changes in freq,severity,wuality, no vision changes. No other focal neurologic deficits, associated symptoms, inciting events or modifiable factors.  Reviewed notes, labs and imaging from outside physicians, which showed:  I reviewed Dr. Carie CaddyKaur's notes.  This is a 43 year old single white female with many years history of mood disorder, adult ODD, complaining of headaches off and on for several years.  Had various tests 9 days in [facility].  Unclear from the notes but may have had a sleep study, CT of the brain, appears to be on Effexor, may have Cushing syndrome, Lamictal, Xanax, Vyvanse, very difficult to read handwritten notes, also on Belvique.  [facility].  [facility]  She had a split study report.  At the time of this report in early 2020 she was on Vyvanse,  spironolactone, metformin, Synthroid, Lamictal, Xanax.  Sleep procedure was a PSG with CPAP or bilevel Pap.  AHI 14.2, supine 21.5, off supine 7.9, REM AHI was 38 I reviewed data, mean oxygen saturation was 94% with a minimum oxygen saturation of 91%.  I reviewed MRI of the brain images, unremarkable  Review of Systems: Patient complains of symptoms per HPI as well as the following symptoms: headache, dizziness, swelling. Pertinent negatives and positives per HPI. All others negative.   Social History   Socioeconomic History  . Marital status: Single    Spouse name: Not on file  . Number of children: 0  . Years of education: Not on file  . Highest education level: Not on file  Occupational History  . Not on file  Social Needs  . Financial resource strain: Not on file  . Food insecurity    Worry: Not on file    Inability: Not on file  . Transportation needs    Medical: Not on file    Non-medical: Not on file  Tobacco Use  . Smoking status: Current Some Day Smoker    Types: Cigarettes  . Smokeless tobacco: Never Used  . Tobacco comment: smoking off and on with a drink since 15. Stopped muliple times.   Substance and Sexual Activity  . Alcohol use: Yes    Alcohol/week: 2.0 standard drinks    Types: 2 Glasses of wine per week  . Drug use: No  . Sexual activity: Never    Birth control/protection: None  Lifestyle  . Physical activity  Days per week: Not on file    Minutes per session: Not on file  . Stress: Not on file  Relationships  . Social Musicianconnections    Talks on phone: Not on file    Gets together: Not on file    Attends religious service: Not on file    Active member of club or organization: Not on file    Attends meetings of clubs or organizations: Not on file    Relationship status: Not on file  . Intimate partner violence    Fear of current or ex partner: Not on file    Emotionally abused: Not on file    Physically abused: Not on file    Forced sexual  activity: Not on file  Other Topics Concern  . Not on file  Social History Narrative   Lives at home with her 2 dogs   Right handed   Caffeine: coffee in the morning, tea during the day. About 2 cups/day    Family History  Problem Relation Age of Onset  . Cancer Mother   . Migraines Mother   . Hypertension Father   . Hyperlipidemia Father   . Migraines Sister   . Cancer Maternal Grandmother   . Cancer Paternal Grandmother   . Heart disease Paternal Grandmother   . Diabetes Paternal Grandmother     Past Medical History:  Diagnosis Date  . Abnormal Pap smear of cervix    yrs ago, possible biopsy also  . ADHD (attention deficit hyperactivity disorder) 1990  . Anemia   . Anxiety   . Bipolar affective disorder (HCC)   . Carpal tunnel syndrome, bilateral   . Depression   . PCOS (polycystic ovarian syndrome)   . Thyroid disease   . Vertigo     Patient Active Problem List   Diagnosis Date Noted  . Chronic migraine without aura without status migrainosus, not intractable 02/09/2019  . OSA (obstructive sleep apnea) - not on cpap 02/09/2019  . Bipolar 1 disorder (HCC) 11/12/2012  . PCOS (polycystic ovarian syndrome) 11/12/2012  . Unspecified hypothyroidism 11/12/2012    Past Surgical History:  Procedure Laterality Date  . CARPAL TUNNEL RELEASE  2010   Left only  . CO2 LASER APPLICATION  2014  . EYE SURGERY     Lasic  . GUM SURGERY  ~2008    Current Outpatient Medications  Medication Sig Dispense Refill  . ALPRAZolam (XANAX) 1 MG tablet Take 1 mg by mouth at bedtime as needed for sleep.    . Cholecalciferol (VITAMIN D PO) Take 2,000 Units by mouth daily.     [name]. lamoTRIgine (LAMICTAL) 150 MG tablet Take 150 mg by mouth daily.    [name]. levothyroxine (SYNTHROID, LEVOTHROID) 75 MCG tablet Take 75 mcg by mouth daily before breakfast.    . lisdexamfetamine (VYVANSE) 70 MG capsule Take by mouth.    . meclizine (ANTIVERT) 25 MG tablet Take 25 mg by mouth as needed for dizziness.     . metFORMIN (GLUCOPHAGE) 500 MG tablet TAKE ONE TABLET TWICE DAILY WITH A MEAL 60 tablet 0  . SAXENDA 18 MG/3ML SOPN Inject 2.4 mg into the skin daily.     [name]. spironolactone (ALDACTONE) 50 MG tablet Take 50 mg by mouth daily.    [name]. venlafaxine XR (EFFEXOR-XR) 75 MG 24 hr capsule Take 75 mg by mouth daily with breakfast.    . Vitamin D, Ergocalciferol, (DRISDOL) 50000 UNITS CAPS capsule Take 1 capsule by mouth once a week.    .[name]  Fremanezumab-vfrm (AJOVY) 225 MG/1.5ML SOAJ Inject 225 mg into the skin every 30 (thirty) days. 1 pen 11   No current facility-administered medications for this visit.     Allergies as of 02/09/2019  . (No Known Allergies)    Vitals: BP 108/77 (BP Location: Left Arm, Patient Position: Sitting)   Pulse 93   Temp (!) 97.1 F (36.2 C) Comment: taken by front staff  Ht 5\' 5"  (1.651 m)   Wt 200 lb (90.7 kg)   BMI 33.28 kg/m  Last Weight:  Wt Readings from Last 1 Encounters:  02/09/19 200 lb (90.7 kg)   Last Height:   Ht Readings from Last 1 Encounters:  02/09/19 5\' 5"  (1.651 m)     Physical exam: Exam: Gen: NAD, conversant, well nourised, obese, well groomed                     CV: RRR, no MRG. No Carotid Bruits. No peripheral edema, warm, nontender Eyes: Conjunctivae clear without exudates or hemorrhage  Neuro: Detailed Neurologic Exam  Speech:    Speech is normal; fluent and spontaneous with normal comprehension.  Cognition:    The patient is oriented to person, place, and time;     recent and remote memory intact;     language fluent;     normal attention, concentration,     fund of knowledge Cranial Nerves:    The pupils are equal, round, and reactive to light. The fundi are normal and spontaneous venous pulsations are present. Visual fields are full to finger confrontation. Extraocular movements are intact. Trigeminal sensation is intact and the muscles of mastication are normal. The face is symmetric. The palate elevates in the midline.  Hearing intact. Voice is normal. Shoulder shrug is normal. The tongue has normal motion without fasciculations.   Coordination:    Normal finger to nose and heel to shin. Normal rapid alternating movements.   Gait:    Heel-toe and tandem gait are normal.   Motor Observation:    No asymmetry, no atrophy, and no involuntary movements noted. Tone:    Normal muscle tone.    Posture:    Posture is normal. normal erect    Strength:    Strength is V/V in the upper and lower limbs.      Sensation: intact to LT     Reflex Exam:  DTR's:    Deep tendon reflexes in the upper and lower extremities are normal bilaterally.   Toes:    The toes are downgoing bilaterally.   Clonus:    Clonus is absent.    Assessment/Plan:  43 year old with headaches in the setting of untreated sleep apnea. AHI 14.2, supine 21.5, off supine 7.9, REM AHI was 38 I reviewed data, mean oxygen saturation was 94% with a minimum oxygen saturation of 91%.  - Headache and fatigue: Large contribution or entirely due to untreated sleep apnea, heavily to encourage her to follow up. She is not using her cpap: Had eval at [facility], needs to followup with Dr. Virgina Jock and Dr. Virgina Jock may need to refer her for cpap if he cannot order it. But this can be the cause of headaches, her AHI was significant(AHI 14.2, supine 21.5, off supine 7.9, REM AHI was 38 I reviewed data, mean oxygen saturation was 94% with a minimum oxygen saturation of 91%). She has to see Dr. Virgina Jock to get her cpap ordered or make a referral to our sleep dotors but I am not  sure our sleep doctors will see her she declines seeing Dr. Dorthula Perfectohmeier(she no-showed her appointment with Dr. Vickey Hugerohmeier) who she has seen in the past and is one of our sleep doctors. Dr. Timothy Lassousso would have to personally refer her back to Dr. Frances FurbishAthar and see if she accepts.  - Start on Ajovy for migraine prevention. She is on too many medications do not want to start a new oral medication, Ajovy has  no significant side effects or medication interactions or contraindications.   - Discussed: To prevent or relieve headaches, try the following: Cool Compress. Lie down and place a cool compress on your head.  Avoid headache triggers. If certain foods or odors seem to have triggered your migraines in the past, avoid them. A headache diary might help you identify triggers.  Include physical activity in your daily routine. Try a daily walk or other moderate aerobic exercise.  Manage stress. Find healthy ways to cope with the stressors, such as delegating tasks on your to-do list.  Practice relaxation techniques. Try deep breathing, yoga, massage and visualization.  Eat regularly. Eating regularly scheduled meals and maintaining a healthy diet might help prevent headaches. Also, drink plenty of fluids.  Follow a regular sleep schedule. Sleep deprivation might contribute to headaches Consider biofeedback. With this mind-body technique, you learn to control certain bodily functions - such as muscle tension, heart rate and blood pressure - to prevent headaches or reduce headache pain.    Proceed to emergency room if you experience new or worsening symptoms or symptoms do not resolve, if you have new neurologic symptoms or if headache is severe, or for any concerning symptom.   Provided education and documentation from American headache Society toolbox including articles on: chronic migraine medication overuse headache, chronic migraines, prevention of migraines, behavioral and other nonpharmacologic treatments for headache.   Meds ordered this encounter  Medications  . Fremanezumab-vfrm (AJOVY) 225 MG/1.5ML SOAJ    Sig: Inject 225 mg into the skin every 30 (thirty) days.    Dispense:  1 pen    Refill:  11    Cc: Creola Cornusso, John, MD  Naomie DeanAntonia , MD  [facility] [address] [place], [place] [id number]  Phone [phone] Fax [phone]  A total of 60  minutes was spent face-to-face with this patient. Over half this time was spent on counseling patient on the  1. Chronic migraine without aura without status migrainosus, not intractable   2. OSA (obstructive sleep apnea)   3. OSA (obstructive sleep apnea) - not on cpap    diagnosis and different diagnostic and therapeutic options, counseling and coordination of care, risks ans benefits of management, compliance, or risk factor reduction and education.

## 2019-02-11 ENCOUNTER — Telehealth: Payer: Self-pay | Admitting: Neurology

## 2019-02-11 NOTE — Telephone Encounter (Signed)
PA submitted and completed through [facility] on Cover my meds. Can take up to 3 days before hearing back. YME:BRAXENMM

## 2019-02-16 NOTE — Telephone Encounter (Signed)
I called the pt and LVM (ok per DPR) asking for a call back to let us know if she would like to use the savings card for Ajovy or if she prefers to switch to Aimovig or Emgality. I advised her insurance denied the medication. The savings card should still work and it should be $5 per month. Left office number in message.

## 2019-02-16 NOTE — Telephone Encounter (Signed)
I am fine with switching her if she is ok with that, or she can use the copay card whichever.

## 2019-02-16 NOTE — Telephone Encounter (Signed)
Received determination from [facility]. Ajovy denied. It is covered when Aimovig and Emgality 120 mg have been tried and did not work, or both were not tolerated.

## 2019-02-18 NOTE — Telephone Encounter (Signed)
Pt is asking for a call back from RN Bethany re:The savings card

## 2019-02-18 NOTE — Telephone Encounter (Signed)
I returned the pt's call. She stated she will use the savings card. She stated she is anxious to get on the CPAP. She called Dr. Virgina Jock and stated his staff said they would send a referral here for sleep. I do not see a referral yet.

## 2019-02-22 NOTE — Telephone Encounter (Signed)
I checked with our sleep center. Per office policy, unable to accept pts who have had sleep studies within the previous 3 years. Dr. Keane Police office was made aware by our sleep center that pt will need to be referred to another sleep center.

## 2019-02-24 ENCOUNTER — Encounter

## 2019-02-24 ENCOUNTER — Ambulatory Visit: Payer: Self-pay | Admitting: Neurology

## 2019-03-04 ENCOUNTER — Ambulatory Visit: Payer: BC Managed Care – PPO | Admitting: Pulmonary Disease

## 2019-03-04 ENCOUNTER — Other Ambulatory Visit: Payer: Self-pay

## 2019-03-04 ENCOUNTER — Encounter: Payer: Self-pay | Admitting: Pulmonary Disease

## 2019-03-04 VITALS — BP 118/82 | HR 103 | Temp 98.8°F | Ht 64.0 in | Wt 196.0 lb

## 2019-03-04 DIAGNOSIS — G4733 Obstructive sleep apnea (adult) (pediatric): Secondary | ICD-10-CM

## 2019-03-04 NOTE — Patient Instructions (Signed)
Will arrange for auto CPAP set up  Follow up in 2 months 

## 2019-03-04 NOTE — Progress Notes (Signed)
Lisa Whitney, Critical Care, and Sleep Medicine  Chief Complaint  Patient presents with  . Consult    Dx with Atypical Insomina 2013 and was told she did not need a cpap. She repeated the sleep study this year and was told she will need a cpap. She reports she is having insomnia for years, she reports episodes of falling asleep frequently. She reports weight gain of 60lb and being tested for cushings disease. Reports she quit smoking a year ago.     Constitutional:  BP 118/82   Pulse (!) 103   Temp 98.8 F (37.1 C) (Oral)   Ht 5\' 4"  (1.626 m)   Wt 196 lb (88.9 kg)   SpO2 100%   BMI 33.64 kg/m   Past Medical History:  Polycystic ovary syndrome, Hypothyroidism, Depression, Bipolar, Anxiety, ADHD, Headaches  Brief Summary:  Lisa Whitney is a 43 y.o. female smoker with hypersomnia.  She had sleep study in 2013.  Negative for sleep apnea.  There has been concern about her thyroid and possible Cushing syndrome.  She gained about 60 lbs.  Her sleep got worse.  Seen in [facility]  Had sleep study there and found to have mild sleep apnea, but moderate to severe in supine sleep.  Wasn't ever set up with CPAP.  She goes to bed at 10 pm, but doesn't fall asleep until 12 am.  She gets out of bed between 6 and 8 am.  She works from home so her schedule is flexible.  She lives with her dogs.  She was told during sleep study that she snores, but wasn't aware of this previously.  There are some nights that she can sleep well, but others that she barely sleeps at all.  She tries to avoid napping.  She tried a mouth guard before from bruxism.  Not using anything to help her sleep.  She denies sleep walking, sleep talking, or nightmares.  There is no history of restless legs.  She denies sleep hallucinations, sleep paralysis, or cataplexy.  The Epworth score is 9 out of 24.    Physical Exam:   Appearance - well kempt   ENMT - clear nasal mucosa, midline nasal  septum, no oral  exudates, no LAN, trachea midline, MP 3, elongated uvula, 2+ tonsils  Respiratory - normal chest wall, normal respiratory effort, no accessory muscle use, no wheeze/rales  CV - s1s2 regular rate and rhythm, no murmurs, no peripheral edema, radial pulses symmetric  GI - soft, non tender, no masses  Lymph - no adenopathy noted in neck and axillary areas  MSK - normal gait  Ext - no cyanosis, clubbing, or joint inflammation noted  Skin - no rashes, lesions, or ulcers  Neuro - normal strength, oriented x 3  Psych - normal mood and affect  Discussion:  She has snoring, sleep disruption, apnea, and daytime sleepiness.  She has history of mood disorders.  Her sleep study showed mild sleep apnea, but was moderate to severe in supine sleep.  Assessment/Plan:   Snoring with excessive daytime sleepiness from obstructive sleep apnea - various therapies for treatment were reviewed: CPAP, oral appliance, and surgical interventions - will arrange for auto CPAP set up - she might buy equipment on her own if she has trouble with insurance coverage  Obesity. - discussed how weight can impact sleep and risk for sleep disordered breathing - discussed options to assist with weight loss: combination of diet modification, cardiovascular and strength training exercises  Cardiovascular risk. -  had an extensive discussion regarding the adverse health consequences related to untreated sleep disordered breathing - specifically discussed the risks for hypertension, coronary artery disease, cardiac dysrhythmias, cerebrovascular disease, and diabetes - lifestyle modification discussed  Safe driving practices. - discussed how sleep disruption can increase risk of accidents, particularly when driving - safe driving practices were discussed  Endocrine disorders. - she is followed by Dr. Talmage NapBalan  - discussed how sleep disruption can contribute to dysregulation of endocrine hormones  Irregular sleep-wake  pattern. - reassess whether she needs therapy for circadian rhythm disorder after she is established on therapy for sleep apnea  Hx of Bipolar, depression, anxiety, ADHD. - followed by Dr. Evelene CroonKaur   Patient Instructions  Will arrange for auto CPAP set up  Follow up in 2 months  A total of  38 minutes were spent face to face with the patient and more than half of that time involved counseling or coordination of care.   Coralyn HellingVineet Smaran Gaus, MD [place] [facility]/Critical Care Pager: [phone] 03/04/2019, 12:48 PM  Flow Sheet    Sleep tests:  PSG 02/11/12 >> AHI 0 PSG 2020 >> AHI 14.2, SpO2 low 91%, CPAP 10 cm H2O  Medications:   Allergies as of 03/04/2019   No Known Allergies     Medication List       Accurate as of March 04, 2019 12:48 PM. If you have any questions, ask your nurse or doctor.        Ajovy 225 MG/1.5ML Soaj Generic drug: Fremanezumab-vfrm Inject 225 mg into the skin every 30 (thirty) days.   ALPRAZolam 1 MG tablet Commonly known as: XANAX Take 1 mg by mouth at bedtime as needed for sleep.   LaMICtal 150 MG tablet Generic drug: lamoTRIgine Take 150 mg by mouth daily.   levothyroxine 75 MCG tablet Commonly known as: SYNTHROID Take 75 mcg by mouth daily before breakfast.   lisdexamfetamine 70 MG capsule Commonly known as: VYVANSE Take by mouth.   meclizine 25 MG tablet Commonly known as: ANTIVERT Take 25 mg by mouth as needed for dizziness.   metFORMIN 500 MG tablet Commonly known as: GLUCOPHAGE TAKE ONE TABLET TWICE DAILY WITH A MEAL   Saxenda 18 MG/3ML Sopn Generic drug: Liraglutide -Weight Management Inject 2.4 mg into the skin daily.   spironolactone 50 MG tablet Commonly known as: ALDACTONE Take 50 mg by mouth daily.   venlafaxine XR 75 MG 24 hr capsule Commonly known as: EFFEXOR-XR Take 75 mg by mouth daily with breakfast.   Vitamin D (Ergocalciferol) 1.25 MG (50000 UT) Caps capsule Commonly known as: DRISDOL Take 1 capsule by  mouth once a week.   VITAMIN D PO Take 2,000 Units by mouth daily.       Past Surgical History:  She  has a past surgical history that includes Carpal tunnel release (2010); Eye surgery; Co2 laser application (2014); and Gum surgery (~2008).  Family History:  Her family history includes Cancer in her maternal grandmother, mother, and paternal grandmother; Diabetes in her paternal grandmother; Heart disease in her paternal grandmother; Hyperlipidemia in her father; Hypertension in her father; Migraines in her mother and sister.  Social History:  She  reports that she has been smoking cigarettes. She has never used smokeless tobacco. She reports current alcohol use of about 2.0 standard drinks of alcohol per week. She reports that she does not use drugs.

## 2019-03-12 ENCOUNTER — Telehealth: Payer: Self-pay | Admitting: Pulmonary Disease

## 2019-03-12 NOTE — Telephone Encounter (Signed)
Message sent to [facility] will wait on there response.

## 2019-03-15 ENCOUNTER — Other Ambulatory Visit: Payer: Self-pay

## 2019-03-15 NOTE — Telephone Encounter (Signed)
Left a message for Lisa Whitney to call back

## 2019-03-16 NOTE — Telephone Encounter (Signed)
lmam for Joellen Jersey again on her number

## 2019-03-17 ENCOUNTER — Other Ambulatory Visit (HOSPITAL_COMMUNITY)
Admission: RE | Admit: 2019-03-17 | Discharge: 2019-03-17 | Disposition: A | Discharge status: Home / Self Care | Payer: BC Managed Care – PPO | Source: Ambulatory Visit | Attending: Certified Nurse Midwife | Admitting: Certified Nurse Midwife

## 2019-03-17 ENCOUNTER — Other Ambulatory Visit: Payer: Self-pay

## 2019-03-17 ENCOUNTER — Encounter: Payer: Self-pay | Admitting: Certified Nurse Midwife

## 2019-03-17 ENCOUNTER — Ambulatory Visit: Payer: BC Managed Care – PPO | Admitting: Certified Nurse Midwife

## 2019-03-17 VITALS — BP 110/68 | HR 68 | Temp 96.8°F | Resp 16 | Ht 64.5 in | Wt 196.0 lb

## 2019-03-17 DIAGNOSIS — M5136 Other intervertebral disc degeneration, lumbar region: Secondary | ICD-10-CM | POA: Insufficient documentation

## 2019-03-17 DIAGNOSIS — D649 Anemia, unspecified: Secondary | ICD-10-CM | POA: Insufficient documentation

## 2019-03-17 DIAGNOSIS — Z01419 Encounter for gynecological examination (general) (routine) without abnormal findings: Secondary | ICD-10-CM | POA: Diagnosis not present

## 2019-03-17 DIAGNOSIS — G43909 Migraine, unspecified, not intractable, without status migrainosus: Secondary | ICD-10-CM | POA: Insufficient documentation

## 2019-03-17 DIAGNOSIS — G4700 Insomnia, unspecified: Secondary | ICD-10-CM | POA: Insufficient documentation

## 2019-03-17 DIAGNOSIS — Z124 Encounter for screening for malignant neoplasm of cervix: Secondary | ICD-10-CM

## 2019-03-17 NOTE — Patient Instructions (Signed)

## 2019-03-17 NOTE — Progress Notes (Signed)
43 y.o. G1P0010 Single  Caucasian Fe here to re- establish gyn care and for annual exam. Periods have changed over the past year with brown discharge at onset and some with 10 day duration. Period in June was heavy with clotting, duration 5-6 days. Marland KitchenHas been seen at [facility] for diagnosis of Cushing Syndromes. Has been seeing Dr. Virgina Jock for evaluation for possible kidney stones with [place], all normal. Had CT at [facility] clinic. Sees Dr. Chalmers Cater for endocrinology. Recent abdominal US for ? Pain in hip, abdominal area, all normal. Patient is concerned she has something wrong in hip mid-abdomen area. Feels like menstrual cramps at times. Did not notice for several weeks and then again in the past few days. " I worry about cancer all the time, and I a so much medication.". The current method of family planning is  abstinence.   Working well. No STD screening needed, no partner change. No other health issues today.  Exercising: Yes.    some walking Smoker:  no  Review of Systems  Constitutional: Negative.   HENT: Negative.   Eyes: Negative.   Respiratory: Negative.   Cardiovascular: Negative.   Gastrointestinal: Negative.   Genitourinary:       Pelvic pain on rt side by hip bone to back that comes & goes  Musculoskeletal: Negative.   Skin: Negative.   Neurological: Negative.   Endo/Heme/Allergies: Negative.   Psychiatric/Behavioral: Negative.     Health Maintenance: Pap:  12-22-14 neg History of Abnormal Pap: yes MMG:  12/18 bilateral mmg & u/s neg Self Breast exams: no Colonoscopy: none BMD:  none TDaP:  2010, to check with dr Virgina Jock, ?UTD Shingles: no Pneumonia: no Hep C and HIV: not done Labs: if needed   reports that she has quit smoking. Her smoking use included cigarettes. She has never used smokeless tobacco. She reports previous alcohol use. She reports that she does not use drugs.  Past Medical History:  Diagnosis Date  . Abnormal Pap smear of cervix    yrs ago, possible  biopsy also  . ADHD (attention deficit hyperactivity disorder) 1990  . Anemia   . Anxiety   . Bipolar affective disorder ([place])   . Carpal tunnel syndrome, bilateral   . Depression   . Elevated cortisol level (HCC)   . PCOS (polycystic ovarian syndrome)   . Polycystic ovarian syndrome   . Sleep apnea   . Thyroid disease   . Vertigo     Past Surgical History:  Procedure Laterality Date  . CARPAL TUNNEL RELEASE  2010   Left only  . CO2 LASER APPLICATION  2703  . EYE SURGERY     Lasic  . GUM SURGERY  ~2008    Current Outpatient Medications  Medication Sig Dispense Refill  . Acetaminophen (TYLENOL PO) Take by mouth as needed.    . ALPRAZolam (XANAX) 1 MG tablet Take 1 mg by mouth at bedtime as needed for sleep.    [name] aspirin-acetaminophen-caffeine (EXCEDRIN MIGRAINE) 250-250-65 MG tablet Take by mouth every 6 (six) hours as needed for headache.    . Cholecalciferol (VITAMIN D PO) Take 2,000 Units by mouth daily.     . diphenhydrAMINE HCl (BENADRYL PO) Take by mouth.    . Ibuprofen (ADVIL PO) Take by mouth. As needed    . lamoTRIgine (LAMICTAL) 150 MG tablet Take 150 mg by mouth daily.    [name] levothyroxine (SYNTHROID, LEVOTHROID) 75 MCG tablet Take 75 mcg by mouth daily before breakfast.    .  lisdexamfetamine (VYVANSE) 70 MG capsule Take by mouth.    . loratadine (CLARITIN) 10 MG tablet Take 10 mg by mouth daily.    . meclizine (ANTIVERT) 25 MG tablet Take 25 mg by mouth as needed for dizziness.    . metFORMIN (GLUCOPHAGE) 500 MG tablet TAKE ONE TABLET TWICE DAILY WITH A MEAL 60 tablet 0  . SAXENDA 18 MG/3ML SOPN Inject 3 mg into the skin daily.     [name]. spironolactone (ALDACTONE) 50 MG tablet Take 50 mg by mouth daily.    [name]. SYNTHROID 88 MCG tablet     . venlafaxine XR (EFFEXOR-XR) 75 MG 24 hr capsule Take 75 mg by mouth daily with breakfast.    . Vitamin D, Ergocalciferol, (DRISDOL) 50000 UNITS CAPS capsule Take 1 capsule by mouth once a week.    . Fremanezumab-vfrm (AJOVY) 225  MG/1.5ML SOAJ Inject 225 mg into the skin every 30 (thirty) days. (Patient not taking: Reported on 03/04/2019) 1 pen 11   No current facility-administered medications for this visit.     Family History  Problem Relation Age of Onset  . Cancer Mother   . Migraines Mother   . Hypertension Father   . Hyperlipidemia Father   . Migraines Sister   . Cancer Maternal Grandmother   . Cancer Paternal Grandmother   . Heart disease Paternal Grandmother   . Diabetes Paternal Grandmother     ROS:  Pertinent items are noted in HPI.  Otherwise, a comprehensive ROS was negative.  Exam:   BP 110/68   Pulse 68   Temp (!) 96.8 F (36 C) (Skin)   Resp 16   Ht 5' 4.5" (1.638 m)   Wt 196 lb (88.9 kg)   LMP 03/09/2019 (Exact Date)   BMI 33.12 kg/m  Height: 5' 4.5" (163.8 cm) Ht Readings from Last 3 Encounters:  03/17/19 5' 4.5" (1.638 m)  03/04/19 5\' 4"  (1.626 m)  02/09/19 5\' 5"  (1.651 m)    General appearance: alert, cooperative and appears stated age Head: Normocephalic, without obvious abnormality, atraumatic Neck: no adenopathy, supple, symmetrical, trachea midline and thyroid no nodules noted Lungs: clear to auscultation bilaterally Breasts: normal appearance, no masses or tenderness, No nipple retraction or dimpling, No nipple discharge or bleeding, No axillary or supraclavicular adenopathy Heart: regular rate and rhythm Abdomen: soft, non-tender; no masses,  no organomegaly, positive bowel sounds normal, no pain noted with palpation and deep palpation, no rebound or with flexion/extension of leg. Extremities: extremities normal, atraumatic, no cyanosis or edema Skin: Skin color, texture, turgor normal. No rashes or lesions Lymph nodes: Cervical, supraclavicular, and axillary nodes normal. No abnormal inguinal nodes palpated Neurologic: Grossly normal   Pelvic: External genitalia:  no lesions              Urethra:  normal appearing urethra with no masses, tenderness or lesions               Bartholin's and Skene's: normal                 Vagina: normal appearing vagina with normal color and discharge, no lesions              Cervix: no cervical motion tenderness, no lesions and nulliparous appearance              Pap taken: Yes.   Bimanual Exam:  Uterus:  normal size, contour, position, consistency, mobility, non-tender, anteverted and non tender  Adnexa: normal adnexa, no mass, fullness, tenderness and no tenderness with full abdominal palpation               Rectovaginal: Confirms               Anus:  normal sphincter tone, no lesions  Chaperone present: yes  A:  Well Woman with normal exam  Contraception abstinence  Menses change, normal pelvic exam, suspect related to stopping contraception  Sporadic abdominal pain, negative abdominal US on 01/18/2019 see report. Could not illicit pain on exam. Normal abdominal and pelvic exam  Multiple medications per MD's for anxiety ADHD, Bipolar,depression  Endocrine management of Thyroid and PCOS  [facility]  Mammogram due patient to schedule  P:   Reviewed health and wellness pertinent to exam  Discussed condom use if becomes sexually active.  Discussed menses change could be related to stopping contraception and PCOS history. Patient would PUS to confirm no issues. Discussed could not produce pain with exam. Warning signs with pelvic pain given and need to advise if occurs. Will wait to schedule PUS, pap smear and other report from CT reviewed.  Continue to follow up with MD as indicated.  Pap smear: yes  counseled on breast self exam, mammography screening, STD prevention, HIV risk factors and prevention, adequate intake of calcium and vitamin D, diet and exercise  return annually or prn  An After Visit Summary was printed and given to the patient.

## 2019-03-17 NOTE — Telephone Encounter (Signed)
Called and spoke to [place] at [facility] they have the 08/2018 sleep study I will close this order

## 2019-03-19 LAB — CYTOLOGY - PAP: HPV: NOT DETECTED

## 2019-03-22 ENCOUNTER — Telehealth: Payer: Self-pay | Admitting: *Deleted

## 2019-03-22 ENCOUNTER — Other Ambulatory Visit: Payer: Self-pay | Admitting: *Deleted

## 2019-03-22 DIAGNOSIS — R102 Pelvic and perineal pain: Secondary | ICD-10-CM

## 2019-03-22 NOTE — Telephone Encounter (Signed)
Call to patient. Message given to patient as seen below from Debbi. Patient scheduled for PUS on 03-30-2019 at 1300 with 1330 consult with Dr. Talbert Nan. Patient agreeable to date and time of appointment.   Future order placed for PUS.   Routing to provider and will close encounter.

## 2019-03-22 NOTE — Telephone Encounter (Signed)
She was to advise if continued, so still occurring, so I feel PUS is warranted.  Please schedule

## 2019-03-22 NOTE — Telephone Encounter (Signed)
Call to patient. Message given to patient as seen below from Debbi. Patient verbalized understanding. 06 recall placed. Patient asking about scheduling PUS to evaluate her pain? States she had discussed with Debbi an ultrasound at her appointment and wants to know if Debbi still recommends? Patient still complaining of right sided pain "near hip and below stomach." States it's dull and she notices it more when she moves versus lying still. RN advised would review with Debbi and return call. Patient agreeable.    Notes recorded by Regina Eck, CNM on 03/22/2019 at 7:42 AM EDT  Notify patient her pap smear showed cellular changes consistent with Hyperkeratosis, which is usually due to irritation or changing cells at the time pap was taken. Recommendation is to just repeat pap smear in 6 months, no further evaluation at this time.  HPV was negative 06 pap recall

## 2019-03-29 ENCOUNTER — Other Ambulatory Visit: Payer: Self-pay

## 2019-03-29 NOTE — Progress Notes (Deleted)
GYNECOLOGY  VISIT   HPI: 43 y.o.   Single White or Caucasian Not Hispanic or Latino  female   G1P0010 with Patient's last menstrual period was 03/09/2019 (exact date).   here for     GYNECOLOGIC HISTORY: Patient's last menstrual period was 03/09/2019 (exact date). Contraception:*** Menopausal hormone therapy: ***        OB History    Gravida  1   Para  0   Term  0   Preterm  0   AB  1   Living  0     SAB  0   TAB  0   Ectopic  0   Multiple  0   Live Births                 Patient Active Problem List   Diagnosis Date Noted  . Anemia 03/17/2019  . DDD (degenerative disc disease), lumbar 03/17/2019  . Insomnia 03/17/2019  . Migraines 03/17/2019  . Chronic migraine without aura without status migrainosus, not intractable 02/09/2019  . OSA (obstructive sleep apnea) - not on cpap 02/09/2019  . Disturbance in sleep behavior 06/26/2018  . Acquired hypothyroidism 06/26/2018  . Female hirsutism 06/15/2018  . Hypercortisolemia ([place]) 05/15/2018  . Carpal tunnel syndrome 12/10/2017  . Pain in right hand 12/10/2017  . Obesity, unspecified 07/11/2015  . Bipolar 1 disorder ([place]) 11/12/2012  . Polycystic ovarian syndrome 11/12/2012  . Unspecified hypothyroidism 11/12/2012    Past Medical History:  Diagnosis Date  . Abnormal Pap smear of cervix    yrs ago, possible biopsy also  . ADHD (attention deficit hyperactivity disorder) 1990  . Anemia   . Anxiety   . Bipolar affective disorder ([place])   . Carpal tunnel syndrome, bilateral   . Depression   . Elevated cortisol level (HCC)   . PCOS (polycystic ovarian syndrome)   . Polycystic ovarian syndrome   . Sleep apnea   . Thyroid disease   . Vertigo     Past Surgical History:  Procedure Laterality Date  . CARPAL TUNNEL RELEASE  2010   Left only  . CO2 LASER APPLICATION  7824  . EYE SURGERY     Lasic  . GUM SURGERY  ~2008    Current Outpatient Medications  Medication Sig Dispense Refill  . Acetaminophen  (TYLENOL PO) Take by mouth as needed.    . ALPRAZolam (XANAX) 1 MG tablet Take 1 mg by mouth at bedtime as needed for sleep.    [name] aspirin-acetaminophen-caffeine (EXCEDRIN MIGRAINE) 250-250-65 MG tablet Take by mouth every 6 (six) hours as needed for headache.    . Cholecalciferol (VITAMIN D PO) Take 2,000 Units by mouth daily.     . diphenhydrAMINE HCl (BENADRYL PO) Take by mouth.    . Fremanezumab-vfrm (AJOVY) 225 MG/1.5ML SOAJ Inject 225 mg into the skin every 30 (thirty) days. (Patient not taking: Reported on 03/04/2019) 1 pen 11  . Ibuprofen (ADVIL PO) Take by mouth. As needed    . lamoTRIgine (LAMICTAL) 150 MG tablet Take 150 mg by mouth daily.    [name] levothyroxine (SYNTHROID, LEVOTHROID) 75 MCG tablet Take 75 mcg by mouth daily before breakfast.    . lisdexamfetamine (VYVANSE) 70 MG capsule Take by mouth.    . loratadine (CLARITIN) 10 MG tablet Take 10 mg by mouth daily.    . meclizine (ANTIVERT) 25 MG tablet Take 25 mg by mouth as needed for dizziness.    . metFORMIN (GLUCOPHAGE) 500 MG tablet TAKE ONE TABLET TWICE  DAILY WITH A MEAL 60 tablet 0  . SAXENDA 18 MG/3ML SOPN Inject 3 mg into the skin daily.     [name]. spironolactone (ALDACTONE) 50 MG tablet Take 50 mg by mouth daily.    [name]. SYNTHROID 88 MCG tablet     . venlafaxine XR (EFFEXOR-XR) 75 MG 24 hr capsule Take 75 mg by mouth daily with breakfast.    . Vitamin D, Ergocalciferol, (DRISDOL) 50000 UNITS CAPS capsule Take 1 capsule by mouth once a week.     No current facility-administered medications for this visit.      ALLERGIES: Patient has no known allergies.  Family History  Problem Relation Age of Onset  . Cancer Mother   . Migraines Mother   . Hypertension Father   . Hyperlipidemia Father   . Migraines Sister   . Cancer Maternal Grandmother   . Cancer Paternal Grandmother   . Heart disease Paternal Grandmother   . Diabetes Paternal Grandmother     Social History   Socioeconomic History  . Marital status: Single     Spouse name: Not on file  . Number of children: 0  . Years of education: Not on file  . Highest education level: Not on file  Occupational History  . Not on file  Social Needs  . Financial resource strain: Not on file  . Food insecurity    Worry: Not on file    Inability: Not on file  . Transportation needs    Medical: Not on file    Non-medical: Not on file  Tobacco Use  . Smoking status: Former Smoker    Types: Cigarettes  . Smokeless tobacco: Never Used  . Tobacco comment: smoking off and on with a drink since 15. Stopped muliple times.   Substance and Sexual Activity  . Alcohol use: Not Currently    Comment: three times weekly  . Drug use: No  . Sexual activity: Not Currently    Birth control/protection: Abstinence  Lifestyle  . Physical activity    Days per week: Not on file    Minutes per session: Not on file  . Stress: Not on file  Relationships  . Social Musicianconnections    Talks on phone: Not on file    Gets together: Not on file    Attends religious service: Not on file    Active member of club or organization: Not on file    Attends meetings of clubs or organizations: Not on file    Relationship status: Not on file  . Intimate partner violence    Fear of current or ex partner: Not on file    Emotionally abused: Not on file    Physically abused: Not on file    Forced sexual activity: Not on file  Other Topics Concern  . Not on file  Social History Narrative   Lives at home with her 2 dogs   Right handed   Caffeine: coffee in the morning, tea during the day. About 2 cups/day    ROS  PHYSICAL EXAMINATION:    LMP 03/09/2019 (Exact Date)     General appearance: alert, cooperative and appears stated age Neck: no adenopathy, supple, symmetrical, trachea midline and thyroid {CHL AMB PHY EX THYROID NORM [phone]::"normal to inspection and palpation"} Breasts: {Exam; breast:13139::"normal appearance, no masses or tenderness"} Abdomen: soft, non-tender;  non distended, no masses,  no organomegaly  Pelvic: External genitalia:  no lesions              Urethra:  normal appearing urethra with no masses, tenderness or lesions              Bartholins and Skenes: normal                 Vagina: normal appearing vagina with normal color and discharge, no lesions              Cervix: {CHL AMB PHY EX CERVIX NORM [phone]::"no lesions"}              Bimanual Exam:  Uterus:  {CHL AMB PHY EX UTERUS NORM DEFAULT:[phone]::"normal size, contour, position, consistency, mobility, non-tender"}              Adnexa: {CHL AMB PHY EX ADNEXA NO MASS DEFAULT:[phone]::"no mass, fullness, tenderness"}              Rectovaginal: {yes no:[id number]}.  Confirms.              Anus:  normal sphincter tone, no lesions  Chaperone was present for exam.  ASSESSMENT     PLAN    An After Visit Summary was printed and given to the patient.  *** minutes face to face time of which over 50% was spent in counseling.

## 2019-03-30 ENCOUNTER — Other Ambulatory Visit: Payer: BC Managed Care – PPO

## 2019-03-30 ENCOUNTER — Ambulatory Visit (INDEPENDENT_AMBULATORY_CARE_PROVIDER_SITE_OTHER): Payer: BC Managed Care – PPO

## 2019-03-30 ENCOUNTER — Ambulatory Visit: Payer: BC Managed Care – PPO | Admitting: Obstetrics and Gynecology

## 2019-03-30 ENCOUNTER — Other Ambulatory Visit: Payer: BC Managed Care – PPO | Admitting: Obstetrics and Gynecology

## 2019-03-30 ENCOUNTER — Encounter: Payer: Self-pay | Admitting: Obstetrics and Gynecology

## 2019-03-30 VITALS — BP 118/80 | HR 72 | Temp 97.3°F | Wt 196.0 lb

## 2019-03-30 DIAGNOSIS — R102 Pelvic and perineal pain: Secondary | ICD-10-CM

## 2019-03-30 DIAGNOSIS — N926 Irregular menstruation, unspecified: Secondary | ICD-10-CM | POA: Diagnosis not present

## 2019-03-30 NOTE — Progress Notes (Signed)
GYNECOLOGY  VISIT   HPI: 43 y.o.   Single White or Caucasian Not Hispanic or Latino  female   G1P0010 with Patient's last menstrual period was 03/09/2019 (exact date).   here for evaluation of pelvic pain and AUB.  She was on the nuvaring, went off of it 2 years ago. H/O PCOS. Being evaluated for cushing's disease, has hypothyroidism. She gained 60 lbs in a year.  She now has sleep apnea.   Cycles are monthly x 5-10 days. She can spot for several days prior to her cycle. Her cycle will start, then can stop for 1-2, then comes back heavy. She can saturate a super plus tampon in 3 hours.   3-4 x a year she will have a very painful cycle. Extreme pain in her "left ovary". She has intermittent pain in her RLQ in the last 8-9 months. The pain is sharp or dull. It can last for 3 days or be be gone for a couple of days. Hurts more when she is standing and walking.     When she was on the nuvaring her cycles were better. Doesn't want to be on any medication.   GYNECOLOGIC HISTORY: Patient's last menstrual period was 03/09/2019 (exact date). Contraception: abstinence  Menopausal hormone therapy: None        OB History    Gravida  1   Para  0   Term  0   Preterm  0   AB  1   Living  0     SAB  0   TAB  0   Ectopic  0   Multiple  0   Live Births                 Patient Active Problem List   Diagnosis Date Noted  . Anemia 03/17/2019  . DDD (degenerative disc disease), lumbar 03/17/2019  . Insomnia 03/17/2019  . Migraines 03/17/2019  . Chronic migraine without aura without status migrainosus, not intractable 02/09/2019  . OSA (obstructive sleep apnea) - not on cpap 02/09/2019  . Disturbance in sleep behavior 06/26/2018  . Acquired hypothyroidism 06/26/2018  . Female hirsutism 06/15/2018  . Hypercortisolemia (HCC) 05/15/2018  . Carpal tunnel syndrome 12/10/2017  . Pain in right hand 12/10/2017  . Obesity, unspecified 07/11/2015  . Bipolar 1 disorder (HCC) 11/12/2012   . Polycystic ovarian syndrome 11/12/2012  . Unspecified hypothyroidism 11/12/2012    Past Medical History:  Diagnosis Date  . Abnormal Pap smear of cervix    yrs ago, possible biopsy also  . ADHD (attention deficit hyperactivity disorder) 1990  . Anemia   . Anxiety   . Bipolar affective disorder (HCC)   . Carpal tunnel syndrome, bilateral   . Depression   . Elevated cortisol level (HCC)   . PCOS (polycystic ovarian syndrome)   . Polycystic ovarian syndrome   . Sleep apnea   . Thyroid disease   . Vertigo     Past Surgical History:  Procedure Laterality Date  . CARPAL TUNNEL RELEASE  2010   Left only  . CO2 LASER APPLICATION  2014  . EYE SURGERY     Lasic  . GUM SURGERY  ~2008    Current Outpatient Medications  Medication Sig Dispense Refill  . Acetaminophen (TYLENOL PO) Take by mouth as needed.    . ALPRAZolam (XANAX) 1 MG tablet Take 1 mg by mouth at bedtime as needed for sleep.    [name]. aspirin-acetaminophen-caffeine (EXCEDRIN MIGRAINE) 250-250-65 MG tablet Take by mouth  every 6 (six) hours as needed for headache.    . Cholecalciferol (VITAMIN D PO) Take 2,000 Units by mouth daily.     . diphenhydrAMINE HCl (BENADRYL PO) Take by mouth.    . Fremanezumab-vfrm (AJOVY) 225 MG/1.5ML SOAJ Inject 225 mg into the skin every 30 (thirty) days. 1 pen 11  . Ibuprofen (ADVIL PO) Take by mouth. As needed    . lamoTRIgine (LAMICTAL) 150 MG tablet Take 150 mg by mouth daily.    [name] lisdexamfetamine (VYVANSE) 70 MG capsule Take by mouth.    . loratadine (CLARITIN) 10 MG tablet Take 10 mg by mouth daily.    . meclizine (ANTIVERT) 25 MG tablet Take 25 mg by mouth as needed for dizziness.    . metFORMIN (GLUCOPHAGE) 500 MG tablet TAKE ONE TABLET TWICE DAILY WITH A MEAL 60 tablet 0  . SAXENDA 18 MG/3ML SOPN Inject 3 mg into the skin daily.     [name] spironolactone (ALDACTONE) 50 MG tablet Take 50 mg by mouth daily.    [name] SYNTHROID 88 MCG tablet     . venlafaxine XR (EFFEXOR-XR) 75 MG 24 hr  capsule Take 75 mg by mouth daily with breakfast.    . Vitamin D, Ergocalciferol, (DRISDOL) 50000 UNITS CAPS capsule Take 1 capsule by mouth once a week.     No current facility-administered medications for this visit.      ALLERGIES: Patient has no known allergies.  Family History  Problem Relation Age of Onset  . Cancer Mother 72       melanoma  . Migraines Mother   . Hypertension Father   . Hyperlipidemia Father   . Migraines Sister   . Cancer Maternal Grandmother   . Heart disease Paternal Grandmother   . Diabetes Paternal Grandmother   . Cancer Paternal Grandmother 69       GYN cancer x 2, uterine (either ovarian or cervical)    Social History   Socioeconomic History  . Marital status: Single    Spouse name: Not on file  . Number of children: 0  . Years of education: Not on file  . Highest education level: Not on file  Occupational History  . Not on file  Social Needs  . Financial resource strain: Not on file  . Food insecurity    Worry: Not on file    Inability: Not on file  . Transportation needs    Medical: Not on file    Non-medical: Not on file  Tobacco Use  . Smoking status: Former Smoker    Types: Cigarettes  . Smokeless tobacco: Never Used  . Tobacco comment: smoking off and on with a drink since 15. Stopped muliple times.   Substance and Sexual Activity  . Alcohol use: Yes    Alcohol/week: 3.0 standard drinks    Types: 3 Standard drinks or equivalent per week  . Drug use: No  . Sexual activity: Not Currently    Birth control/protection: Abstinence  Lifestyle  . Physical activity    Days per week: Not on file    Minutes per session: Not on file  . Stress: Not on file  Relationships  . Social Herbalist on phone: Not on file    Gets together: Not on file    Attends religious service: Not on file    Active member of club or organization: Not on file    Attends meetings of clubs or organizations: Not on file    Relationship status:  Not on file  . Intimate partner violence    Fear of current or ex partner: Not on file    Emotionally abused: Not on file    Physically abused: Not on file    Forced sexual activity: Not on file  Other Topics Concern  . Not on file  Social History Narrative   Lives at home with her 2 dogs   Right handed   Caffeine: coffee in the morning, tea during the day. About 2 cups/day    Review of Systems  Constitutional: Negative.   HENT: Negative.   Eyes: Negative.   Respiratory: Negative.   Cardiovascular: Negative.   Gastrointestinal: Negative.   Genitourinary:       Pelvic pain   Musculoskeletal: Negative.   Skin: Negative.   Neurological: Negative.   Endo/Heme/Allergies: Negative.   Psychiatric/Behavioral: Negative.     PHYSICAL EXAMINATION:    BP 118/80   Pulse 72   Temp (!) 97.3 F (36.3 C)   Wt 196 lb (88.9 kg)   LMP 03/09/2019 (Exact Date)   BMI 33.12 kg/m     General appearance: alert, cooperative and appears stated age  Ultrasound: images reviewed, normal.   ASSESSMENT Irregular bleeding Intermittent pelvic pain, recent normal pelvic exam, normal ultrasound today.     PLAN She will calendar her cycles and her pain F/U in 3 months She just had blood work with her Endocrinologist. She reports recent normal TSH, on synthroid.    An After Visit Summary was printed and given to the patient.  ~20 minutes face to face time of which over 50% was spent in counseling.

## 2019-03-30 NOTE — Progress Notes (Unsigned)
GYNECOLOGY  VISIT   HPI: 43 y.o.   Single White or Caucasian Not Hispanic or Latino  female   G1P0010 with Patient's last menstrual period was 03/09/2019 (exact date).   here for consult after PUS.     GYNECOLOGIC HISTORY: Patient's last menstrual period was 03/09/2019 (exact date). Contraception: Abstinence Menopausal hormone therapy: None         OB History    Gravida  1   Para  0   Term  0   Preterm  0   AB  1   Living  0     SAB  0   TAB  0   Ectopic  0   Multiple  0   Live Births                 Patient Active Problem List   Diagnosis Date Noted  . Anemia 03/17/2019  . DDD (degenerative disc disease), lumbar 03/17/2019  . Insomnia 03/17/2019  . Migraines 03/17/2019  . Chronic migraine without aura without status migrainosus, not intractable 02/09/2019  . OSA (obstructive sleep apnea) - not on cpap 02/09/2019  . Disturbance in sleep behavior 06/26/2018  . Acquired hypothyroidism 06/26/2018  . Female hirsutism 06/15/2018  . Hypercortisolemia (HCC) 05/15/2018  . Carpal tunnel syndrome 12/10/2017  . Pain in right hand 12/10/2017  . Obesity, unspecified 07/11/2015  . Bipolar 1 disorder (HCC) 11/12/2012  . Polycystic ovarian syndrome 11/12/2012  . Unspecified hypothyroidism 11/12/2012    Past Medical History:  Diagnosis Date  . Abnormal Pap smear of cervix    yrs ago, possible biopsy also  . ADHD (attention deficit hyperactivity disorder) 1990  . Anemia   . Anxiety   . Bipolar affective disorder (HCC)   . Carpal tunnel syndrome, bilateral   . Depression   . Elevated cortisol level (HCC)   . PCOS (polycystic ovarian syndrome)   . Polycystic ovarian syndrome   . Sleep apnea   . Thyroid disease   . Vertigo     Past Surgical History:  Procedure Laterality Date  . CARPAL TUNNEL RELEASE  2010   Left only  . CO2 LASER APPLICATION  2014  . EYE SURGERY     Lasic  . GUM SURGERY  ~2008    Current Outpatient Medications  Medication Sig  Dispense Refill  . Acetaminophen (TYLENOL PO) Take by mouth as needed.    . ALPRAZolam (XANAX) 1 MG tablet Take 1 mg by mouth at bedtime as needed for sleep.    [name]. aspirin-acetaminophen-caffeine (EXCEDRIN MIGRAINE) 250-250-65 MG tablet Take by mouth every 6 (six) hours as needed for headache.    . Cholecalciferol (VITAMIN D PO) Take 2,000 Units by mouth daily.     . diphenhydrAMINE HCl (BENADRYL PO) Take by mouth.    . Fremanezumab-vfrm (AJOVY) 225 MG/1.5ML SOAJ Inject 225 mg into the skin every 30 (thirty) days. 1 pen 11  . Ibuprofen (ADVIL PO) Take by mouth. As needed    . lamoTRIgine (LAMICTAL) 150 MG tablet Take 150 mg by mouth daily.    [name]. lisdexamfetamine (VYVANSE) 70 MG capsule Take by mouth.    . loratadine (CLARITIN) 10 MG tablet Take 10 mg by mouth daily.    . meclizine (ANTIVERT) 25 MG tablet Take 25 mg by mouth as needed for dizziness.    . metFORMIN (GLUCOPHAGE) 500 MG tablet TAKE ONE TABLET TWICE DAILY WITH A MEAL 60 tablet 0  . SAXENDA 18 MG/3ML SOPN Inject 3 mg into the skin  daily.     . spironolactone (ALDACTONE) 50 MG tablet Take 50 mg by mouth daily.    [name] SYNTHROID 88 MCG tablet     . venlafaxine XR (EFFEXOR-XR) 75 MG 24 hr capsule Take 75 mg by mouth daily with breakfast.    . Vitamin D, Ergocalciferol, (DRISDOL) 50000 UNITS CAPS capsule Take 1 capsule by mouth once a week.     No current facility-administered medications for this visit.      ALLERGIES: Patient has no known allergies.  Family History  Problem Relation Age of Onset  . Cancer Mother   . Migraines Mother   . Hypertension Father   . Hyperlipidemia Father   . Migraines Sister   . Cancer Maternal Grandmother   . Cancer Paternal Grandmother   . Heart disease Paternal Grandmother   . Diabetes Paternal Grandmother     Social History   Socioeconomic History  . Marital status: Single    Spouse name: Not on file  . Number of children: 0  . Years of education: Not on file  . Highest education level:  Not on file  Occupational History  . Not on file  Social Needs  . Financial resource strain: Not on file  . Food insecurity    Worry: Not on file    Inability: Not on file  . Transportation needs    Medical: Not on file    Non-medical: Not on file  Tobacco Use  . Smoking status: Former Smoker    Types: Cigarettes  . Smokeless tobacco: Never Used  . Tobacco comment: smoking off and on with a drink since 15. Stopped muliple times.   Substance and Sexual Activity  . Alcohol use: Yes    Alcohol/week: 3.0 standard drinks    Types: 3 Standard drinks or equivalent per week  . Drug use: No  . Sexual activity: Not Currently    Birth control/protection: Abstinence  Lifestyle  . Physical activity    Days per week: Not on file    Minutes per session: Not on file  . Stress: Not on file  Relationships  . Social Musician on phone: Not on file    Gets together: Not on file    Attends religious service: Not on file    Active member of club or organization: Not on file    Attends meetings of clubs or organizations: Not on file    Relationship status: Not on file  . Intimate partner violence    Fear of current or ex partner: Not on file    Emotionally abused: Not on file    Physically abused: Not on file    Forced sexual activity: Not on file  Other Topics Concern  . Not on file  Social History Narrative   Lives at home with her 2 dogs   Right handed   Caffeine: coffee in the morning, tea during the day. About 2 cups/day    Review of Systems  Constitutional: Negative.   HENT: Negative.   Eyes: Negative.   Respiratory: Negative.   Cardiovascular: Negative.   Gastrointestinal: Negative.   Genitourinary:       Right sided pelvic pain  Musculoskeletal: Negative.   Skin: Negative.   Neurological: Negative.   Endo/Heme/Allergies: Negative.   Psychiatric/Behavioral: Negative.     PHYSICAL EXAMINATION:    BP 118/80 (BP Location: Left Arm, Patient Position: Sitting,  Cuff Size: Large)   Pulse 72   Temp (!) 97.3 F (36.3 C) (  Skin)   Wt 196 lb (88.9 kg)   LMP 03/09/2019 (Exact Date)   BMI 33.12 kg/m     General appearance: alert, cooperative and appears stated age Neck: no adenopathy, supple, symmetrical, trachea midline and thyroid {CHL AMB PHY EX THYROID NORM [phone]::"normal to inspection and palpation"} Breasts: {Exam; breast:13139::"normal appearance, no masses or tenderness"} Abdomen: soft, non-tender; non distended, no masses,  no organomegaly  Pelvic: External genitalia:  no lesions              Urethra:  normal appearing urethra with no masses, tenderness or lesions              Bartholins and Skenes: normal                 Vagina: normal appearing vagina with normal color and discharge, no lesions              Cervix: {CHL AMB PHY EX CERVIX NORM DEFAULT:[phone]::"no lesions"}              Bimanual Exam:  Uterus:  {CHL AMB PHY EX UTERUS NORM DEFAULT:[phone]::"normal size, contour, position, consistency, mobility, non-tender"}              Adnexa: {CHL AMB PHY EX ADNEXA NO MASS DEFAULT:[phone]::"no mass, fullness, tenderness"}              Rectovaginal: {yes no:[id number]}.  Confirms.              Anus:  normal sphincter tone, no lesions  Chaperone was present for exam.  ASSESSMENT     PLAN    An After Visit Summary was printed and given to the patient.  *** minutes face to face time of which over 50% was spent in counseling.

## 2019-05-03 ENCOUNTER — Ambulatory Visit (INDEPENDENT_AMBULATORY_CARE_PROVIDER_SITE_OTHER): Payer: BC Managed Care – PPO | Admitting: Pulmonary Disease

## 2019-05-03 ENCOUNTER — Encounter: Payer: Self-pay | Admitting: Pulmonary Disease

## 2019-05-03 ENCOUNTER — Other Ambulatory Visit: Payer: Self-pay

## 2019-05-03 DIAGNOSIS — G4733 Obstructive sleep apnea (adult) (pediatric): Secondary | ICD-10-CM

## 2019-05-03 NOTE — Progress Notes (Signed)
[facility]  Chief Complaint  Patient presents with  . OSA (obstructive sleep apnea)    Patient stated she has been using the machine. Wants to know what alternatives she can use when she has a stuffy nose.    Constitutional:  There were no vitals taken for this visit.   Deferred.  Past Medical History:  Polycystic ovary syndrome, Hypothyroidism, Depression, Bipolar, Anxiety, ADHD, Headaches  Brief Summary:  Lisa Whitney is a 43 y.o. female smoker with obstructive sleep apnea.  Virtual Visit via Telephone Note  I connected with Lisa Whitney on 05/03/19 at 11:00 AM EDT by telephone and verified that I am speaking with the correct person using two identifiers.  Location: Patient: home Provider: medical office   I discussed the limitations, risks, security and privacy concerns of performing an evaluation and management service by telephone and the availability of in person appointments. I also discussed with the patient that there may be a patient responsible charge related to this service. The patient expressed understanding and agreed to proceed.  She is sleeping better with CPAP.  No issues with mask.  Not having sleep paralysis or nightmares anymore.  Seems like her hormone fluctuations have improved also.  Has SoClean device.     Physical Exam:   Deferred.  Assessment/Plan:   Obstructive sleep apnea. - she is compliant with CPAP and reports benefit - continue auto CPAP  Endocrine disorders. - she is followed by Dr. Talmage Nap  - discussed how sleep disruption can contribute to dysregulation of endocrine hormones  Irregular sleep-wake pattern. - improved after starting CPAP  Hx of Bipolar, depression, anxiety, ADHD. - followed by Dr. Evelene Croon   Patient Instructions  Follow up in 1 year    I discussed the assessment and treatment plan with the patient. The patient was provided an opportunity to ask questions and  all were answered. The patient agreed with the plan and demonstrated an understanding of the instructions.   The patient was advised to call back or seek an in-person evaluation if the symptoms worsen or if the condition fails to improve as anticipated.  I provided 24 minutes of non-face-to-face time during this encounter.     Coralyn Helling, MD [facility] Pager: [phone] 05/03/2019, 12:03 PM  Flow Sheet    Sleep tests:  PSG 02/11/12 >> AHI 0 PSG 2020 >> AHI 14.2, SpO2 low 91%, CPAP 10 cm H2O Auto CPAP 03/31/19 to 04/29/19 >> used on 29 of 30 nights with average 7 hrs 28 min.  Average AHI 1.7 with median CPAP 7 and 95 th percentile CPAP 8 cm H2O.  Medications:   Allergies as of 05/03/2019   No Known Allergies     Medication List       Accurate as of May 03, 2019 12:03 PM. If you have any questions, ask your nurse or doctor.        ADVIL PO Take by mouth. As needed   Ajovy 225 MG/1.5ML Soaj Generic drug: Fremanezumab-vfrm Inject 225 mg into the skin every 30 (thirty) days.   ALPRAZolam 1 MG tablet Commonly known as: XANAX Take 1 mg by mouth at bedtime as needed for sleep.   aspirin-acetaminophen-caffeine 250-250-65 MG tablet Commonly known as: EXCEDRIN MIGRAINE Take by mouth every 6 (six) hours as needed for headache.   BENADRYL PO Take by mouth.   LaMICtal 150 MG tablet Generic drug: lamoTRIgine Take 150 mg by mouth daily.   lisdexamfetamine 70 MG capsule  Commonly known as: VYVANSE Take by mouth.   loratadine 10 MG tablet Commonly known as: CLARITIN Take 10 mg by mouth daily.   meclizine 25 MG tablet Commonly known as: ANTIVERT Take 25 mg by mouth as needed for dizziness.   metFORMIN 500 MG tablet Commonly known as: GLUCOPHAGE TAKE ONE TABLET TWICE DAILY WITH A MEAL   Saxenda 18 MG/3ML Sopn Generic drug: Liraglutide -Weight Management Inject 3 mg into the skin daily.   spironolactone 50 MG tablet Commonly known as:  ALDACTONE Take 50 mg by mouth daily.   Synthroid 88 MCG tablet Generic drug: levothyroxine   TYLENOL PO Take by mouth as needed.   venlafaxine XR 75 MG 24 hr capsule Commonly known as: EFFEXOR-XR Take 75 mg by mouth daily with breakfast.   Vitamin D (Ergocalciferol) 1.25 MG (50000 UT) Caps capsule Commonly known as: DRISDOL Take 1 capsule by mouth once a week.   VITAMIN D PO Take 2,000 Units by mouth daily.       Past Surgical History:  She  has a past surgical history that includes Carpal tunnel release (2010); Eye surgery; Co2 laser application (4132); and Gum surgery (~2008).  Family History:  Her family history includes Cancer in her maternal grandmother; Cancer (age of onset: 110) in her mother; Cancer (age of onset: 54) in her paternal grandmother; Diabetes in her paternal grandmother; Heart disease in her paternal grandmother; Hyperlipidemia in her father; Hypertension in her father; Migraines in her mother and sister.  Social History:  She  reports that she has quit smoking. Her smoking use included cigarettes. She has never used smokeless tobacco. She reports current alcohol use of about 3.0 standard drinks of alcohol per week. She reports that she does not use drugs.

## 2019-05-03 NOTE — Patient Instructions (Signed)
Follow up in 1 year.

## 2019-05-14 ENCOUNTER — Telehealth: Payer: Self-pay | Admitting: General Surgery

## 2019-05-14 NOTE — Telephone Encounter (Signed)
LVMTCB x 1 for patient. The copy of the cpap report was returned by the post office. With notation  "return to sender, attempted - not known, unable to forward".  In voicemail message asked for patient to call back with correct mailing address so report can be sent.

## 2019-07-05 ENCOUNTER — Telehealth: Payer: Self-pay | Admitting: Obstetrics and Gynecology

## 2019-07-05 NOTE — Telephone Encounter (Signed)
Spoke with patient. OV for 12/2 at 11am changed to [place] visit. Reviewed instructions on how to access MyChart and visit, patient verbalizes understanding.   Encounter closed.

## 2019-07-05 NOTE — Telephone Encounter (Signed)
Patient is currently in quarantine due to potential exposure to covid. Would like to know if she can switch her follow up appointment on 07/07/19 to a virtual visit.

## 2019-07-05 NOTE — Telephone Encounter (Signed)
Patient was seen on 03/30/19 for irregular bleeding and pelvic pain. Normal PUS. Patient was to calendar cycles and pain, f/u 3 months.   Dr. Talbert Nan -ok to schedule as MyChart visit for f/u?

## 2019-07-05 NOTE — Telephone Encounter (Signed)
Sure, we can try a virtual visit.

## 2019-07-07 ENCOUNTER — Other Ambulatory Visit: Payer: Self-pay

## 2019-07-07 ENCOUNTER — Telehealth (INDEPENDENT_AMBULATORY_CARE_PROVIDER_SITE_OTHER): Payer: BC Managed Care – PPO | Admitting: Obstetrics and Gynecology

## 2019-07-07 ENCOUNTER — Encounter: Payer: Self-pay | Admitting: Obstetrics and Gynecology

## 2019-07-07 DIAGNOSIS — N946 Dysmenorrhea, unspecified: Secondary | ICD-10-CM | POA: Diagnosis not present

## 2019-07-07 DIAGNOSIS — N926 Irregular menstruation, unspecified: Secondary | ICD-10-CM

## 2019-07-07 MED ORDER — IBUPROFEN 800 MG PO TABS: 800.0000 mg | ORAL_TABLET | Freq: Three times a day (TID) | ORAL | 1 refills | Status: AC | PRN

## 2019-07-07 NOTE — Progress Notes (Signed)
Virtual Visit via Video Note  I connected with Lisa Whitney on 07/07/19 at 11:00 AM EST by a video enabled telemedicine application and verified that I am speaking with the correct person using two identifiers.  Location: Patient: at her home Provider: [facility]   I discussed the limitations of evaluation and management by telemedicine and the availability of in person appointments. The patient expressed understanding and agreed to proceed.    GYNECOLOGY  VISIT   HPI: 43 y.o.   Single White or Caucasian Not Hispanic or Latino  female   G1P0010 with No LMP recorded.   here for f/u on irregular bleeding and pelvic pain. The patient was seen in 8/20, at that time she reported cycles were monthly x 5-10 days. 3-4 x a year she reported extreme pain in her "left ovary" and she reported intermittent pain in her RLQ for 8-9 months. She reported a recent normal TSH, on synthroid. Pap in 8/20 with hyperkeratosis, otherwise negative with negative HPV.  She had a normal pelvic ultrasound at that visit.   She has been calendaring her cycles.  Cycles: 8/4-8/11  9/27-10/6 (10 days), brown spotting on 9/27 with wiping, nothing on 9/28, spotting on the 29 when wiped, on 9/30 more clumpy, on 9/31 she got cramps, increase in spotting to light bleeding. The next few days was like a normal cycle. Stopped bleeding 10/3 evening, 10/4 light, 10/5 at 11 am started bleeding heavily (saturating a super tampon in ~2 hours), then light and intermittent the next 2 days.    10/21 terrible cramps and in bed all day 10/22 light bleeding, 10/23 light, 10/24 started bleeding, not heavy had pain in her pelvis, 10/26 very heavy with clots, 10/27 not as heavy, 10/28 no bleeding,  10/29 more watery.   11/18-24 Not bleeding on the tampon, just in the toilet. Overall shorter and lighter.   Other than with her cycle not currently having baseline pain  The day prior to her cycle she  is very hungry. She gets severe cramps ~ every 3 cycles. She also gets migraines with her cycles.   She had a virtual visit with her Endocrinologist this am.  She has a h/o PCOS, hypothyroidism, Cushing like syndrome  GYNECOLOGIC HISTORY: No LMP recorded. Contraception:abstains Menopausal hormone therapy: none        OB History    Gravida  1   Para  0   Term  0   Preterm  0   AB  1   Living  0     SAB  0   TAB  0   Ectopic  0   Multiple  0   Live Births                 Patient Active Problem List   Diagnosis Date Noted  . Anemia 03/17/2019  . DDD (degenerative disc disease), lumbar 03/17/2019  . Insomnia 03/17/2019  . Migraines 03/17/2019  . Chronic migraine without aura without status migrainosus, not intractable 02/09/2019  . OSA (obstructive sleep apnea) - not on cpap 02/09/2019  . Disturbance in sleep behavior 06/26/2018  . Acquired hypothyroidism 06/26/2018  . Female hirsutism 06/15/2018  . Hypercortisolemia 05/15/2018  . Carpal tunnel syndrome 12/10/2017  . Pain in right hand 12/10/2017  . Obesity, unspecified 07/11/2015  . Bipolar 1 disorder (HCC) 11/12/2012  . Polycystic ovarian syndrome 11/12/2012  . Unspecified hypothyroidism 11/12/2012    Past Medical History:  Diagnosis Date  . Abnormal  Pap smear of cervix    yrs ago, possible biopsy also  . ADHD (attention deficit hyperactivity disorder) 1990  . Anemia   . Anxiety   . Bipolar affective disorder ([place])   . Carpal tunnel syndrome, bilateral   . Depression   . Elevated cortisol level (HCC)   . PCOS (polycystic ovarian syndrome)   . Polycystic ovarian syndrome   . Sleep apnea   . Thyroid disease   . Vertigo     Past Surgical History:  Procedure Laterality Date  . CARPAL TUNNEL RELEASE  2010   Left only  . CO2 LASER APPLICATION  2423  . EYE SURGERY     Lasic  . GUM SURGERY  ~2008    Current Outpatient Medications  Medication Sig Dispense Refill  . Acetaminophen (TYLENOL  PO) Take by mouth as needed.    . ALPRAZolam (XANAX) 1 MG tablet Take 1 mg by mouth at bedtime as needed for sleep.    [name] aspirin-acetaminophen-caffeine (EXCEDRIN MIGRAINE) 250-250-65 MG tablet Take by mouth every 6 (six) hours as needed for headache.    . Cholecalciferol (VITAMIN D PO) Take 2,000 Units by mouth daily.     . diphenhydrAMINE HCl (BENADRYL PO) Take by mouth.    . Fremanezumab-vfrm (AJOVY) 225 MG/1.5ML SOAJ Inject 225 mg into the skin every 30 (thirty) days. 1 pen 11  . Ibuprofen (ADVIL PO) Take by mouth. As needed    . lamoTRIgine (LAMICTAL) 150 MG tablet Take 150 mg by mouth daily.    [name] lisdexamfetamine (VYVANSE) 70 MG capsule Take by mouth.    . loratadine (CLARITIN) 10 MG tablet Take 10 mg by mouth daily.    . meclizine (ANTIVERT) 25 MG tablet Take 25 mg by mouth as needed for dizziness.    . metFORMIN (GLUCOPHAGE) 500 MG tablet TAKE ONE TABLET TWICE DAILY WITH A MEAL 60 tablet 0  . SAXENDA 18 MG/3ML SOPN Inject 3 mg into the skin daily.     [name] spironolactone (ALDACTONE) 50 MG tablet Take 50 mg by mouth daily.    [name] SYNTHROID 88 MCG tablet     . venlafaxine XR (EFFEXOR-XR) 75 MG 24 hr capsule Take 75 mg by mouth daily with breakfast.    . Vitamin D, Ergocalciferol, (DRISDOL) 50000 UNITS CAPS capsule Take 1 capsule by mouth once a week.     No current facility-administered medications for this visit.      ALLERGIES: Patient has no known allergies.  Family History  Problem Relation Age of Onset  . Cancer Mother 46       melanoma  . Migraines Mother   . Hypertension Father   . Hyperlipidemia Father   . Migraines Sister   . Cancer Maternal Grandmother   . Heart disease Paternal Grandmother   . Diabetes Paternal Grandmother   . Cancer Paternal Grandmother 34       GYN cancer x 2, uterine (either ovarian or cervical)    Social History   Socioeconomic History  . Marital status: Single    Spouse name: Not on file  . Number of children: 0  . Years of education: Not  on file  . Highest education level: Not on file  Occupational History  . Not on file  Social Needs  . Financial resource strain: Not on file  . Food insecurity    Worry: Not on file    Inability: Not on file  . Transportation needs    Medical: Not on file  Non-medical: Not on file  Tobacco Use  . Smoking status: Former Smoker    Types: Cigarettes  . Smokeless tobacco: Never Used  . Tobacco comment: smoking off and on with a drink since 15. Stopped muliple times.   Substance and Sexual Activity  . Alcohol use: Yes    Alcohol/week: 3.0 standard drinks    Types: 3 Standard drinks or equivalent per week  . Drug use: No  . Sexual activity: Not Currently    Birth control/protection: Abstinence  Lifestyle  . Physical activity    Days per week: Not on file    Minutes per session: Not on file  . Stress: Not on file  Relationships  . Social Musician on phone: Not on file    Gets together: Not on file    Attends religious service: Not on file    Active member of club or organization: Not on file    Attends meetings of clubs or organizations: Not on file    Relationship status: Not on file  . Intimate partner violence    Fear of current or ex partner: Not on file    Emotionally abused: Not on file    Physically abused: Not on file    Forced sexual activity: Not on file  Other Topics Concern  . Not on file  Social History Narrative   Lives at home with her 2 dogs   Right handed   Caffeine: coffee in the morning, tea during the day. About 2 cups/day    ROS  PHYSICAL EXAMINATION:    There were no vitals taken for this visit.    General appearance: alert, cooperative and appears stated age   ASSESSMENT Irregular cycles, 1 out of the last 4 cycles lasted 10 days, other cycles within normal limits. Normal pelvic exam and ultrasound in 8/20 Intermittent severe dysmenorrhea She has multiple medical issues, recent normal TSH. Followed by Endocrinology for Cushing  like syndrome, PCOS, hypothyroidism    PLAN Continue to calendar her cycles, if she is consistently bleeding for more than 8 days a month would recommend an endometrial biopsy Ibuprofen 800 mg q 8 hours prn cramps Discussed option of retrying the nuvaring, she declines, doesn't want to take anymore hormones, not currently sexually active. F/U for her annual, sooner with any concerns.    The patient was advised to call back or seek an in-person evaluation if the symptoms worsen or if the condition fails to improve as anticipated.  I provided 27 minutes of non-face-to-face time during this encounter. Toward the end of the visit the patient's connection failed and the last few minutes were completed on the phone.    Romualdo Bolk, MD

## 2019-08-12 ENCOUNTER — Telehealth: Payer: Self-pay

## 2019-08-12 ENCOUNTER — Encounter: Payer: Self-pay | Admitting: Family Medicine

## 2019-08-12 ENCOUNTER — Ambulatory Visit: Payer: BC Managed Care – PPO | Admitting: Family Medicine

## 2019-08-12 NOTE — Telephone Encounter (Signed)
Patient was a no call/no show for their appointment today.   

## 2019-10-06 IMAGING — MR MR HEAD WO/W CM
14 of 20 series · 32 of 48 positions shown · IV contrast (10ml multihance)
Comparison: None.

CLINICAL DATA: Pituitary hyperfunction.  High cord dissolved.

EXAM:
MRI HEAD WITHOUT AND WITH CONTRAST
TECHNIQUE: Multiplanar, multiecho pulse sequences of the brain and surrounding
structures were obtained without and with intravenous contrast.
CONTRAST:  10mL MULTIHANCE GADOBENATE DIMEGLUMINE 529 MG/ML IV SOLN

[Series 2: T1 · sagittal · 5.0mm · 0.45mm/px · 3 of 21 slices shown]
[im 1/21]
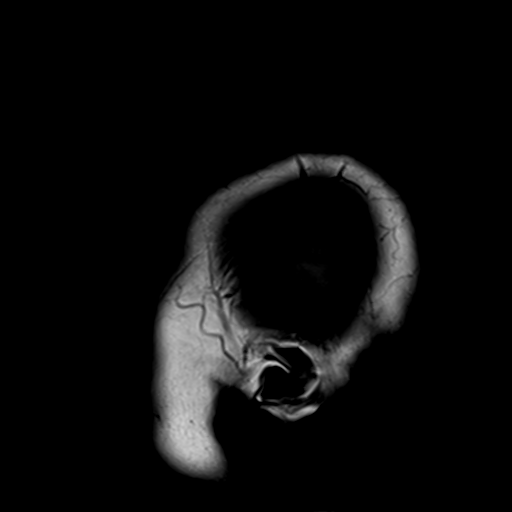
[im 11/21]
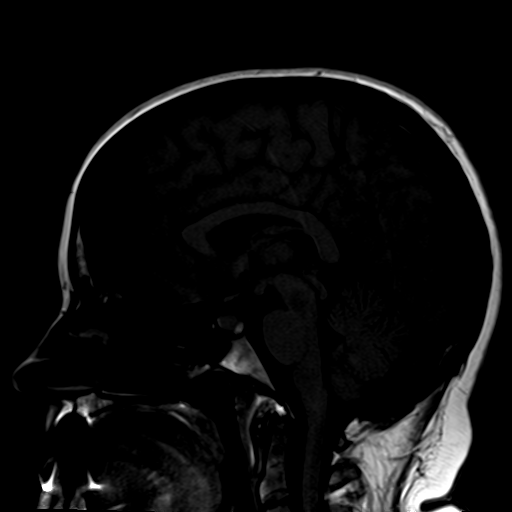
[im 21/21]
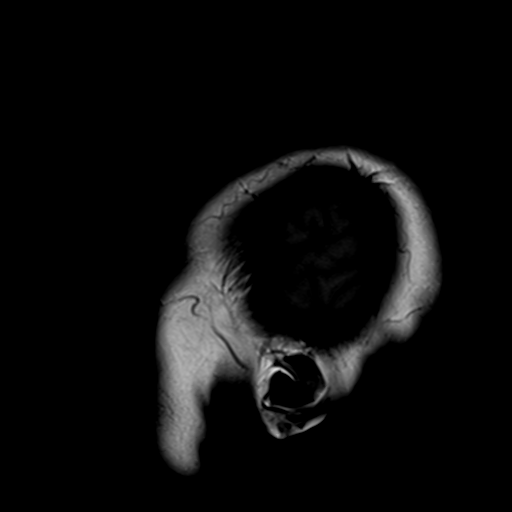

[Series 3: DWI · axial · 3.0mm · 1.80mm/px · z∈[-51,+95]mm · 8 of 100 slices shown]
[im 1/100]
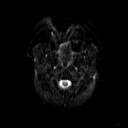
[im 12/100]
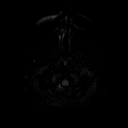
[im 34/100]
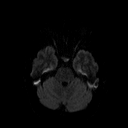
[im 45/100]
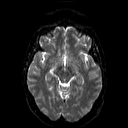
[im 56/100]
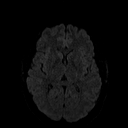
[im 67/100]
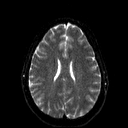
[im 89/100]
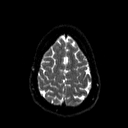
[im 100/100]
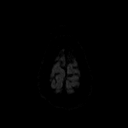

[Series 4: dwi_adc · axial · 3.0mm · 1.80mm/px · z∈[-51,+95]mm · 5 of 50 slices shown]
[im 1/50]
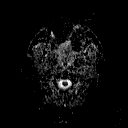
[im 13/50]
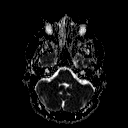
[im 25/50]
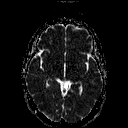
[im 37/50]
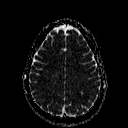
[im 50/50]
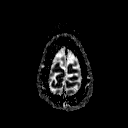

[Series 5: T2 · axial · 5.0mm · 0.36mm/px · z∈[-43,+93]mm · 2 of 22 slices shown]
[im 1/22]
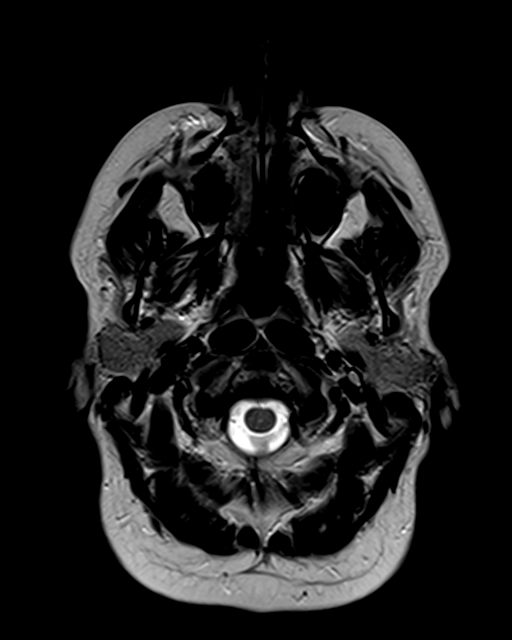
[im 22/22]
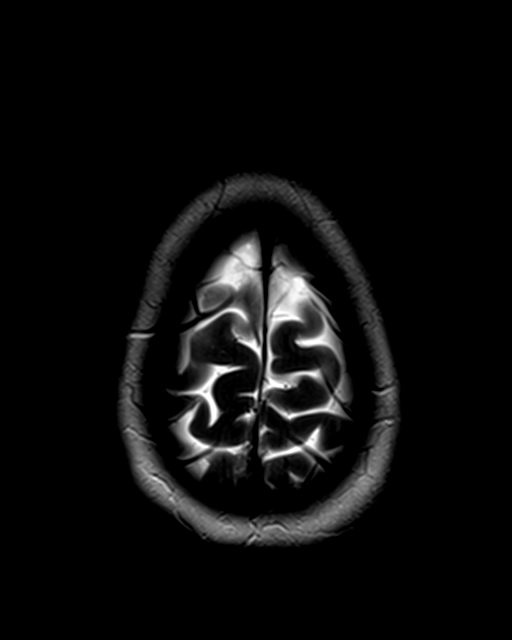

[Series 6: FLAIR · axial · 3.0mm · 0.45mm/px · z∈[-46,+97]mm · 3 of 32 slices shown (1 of 2)]
[im 1/32]
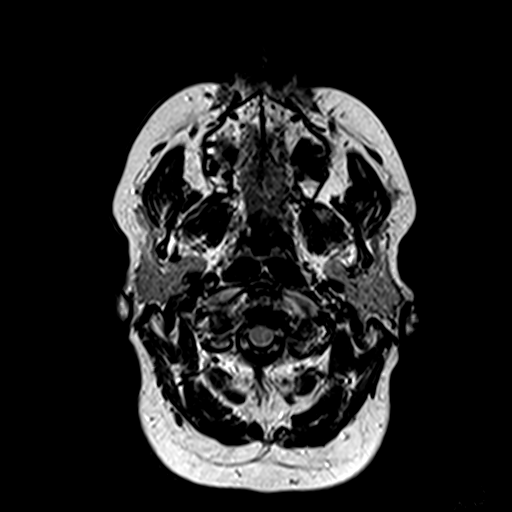
[im 16/32]
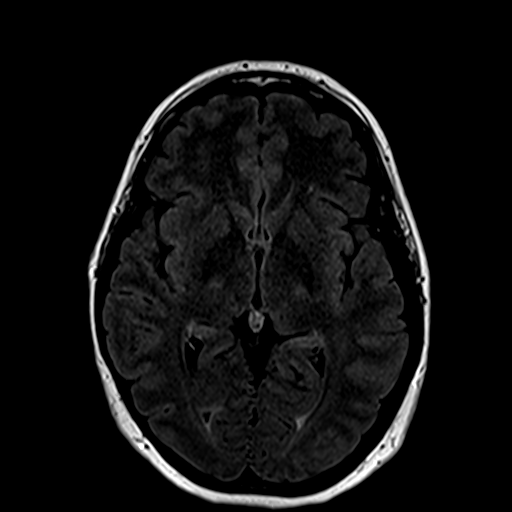
[im 32/32]
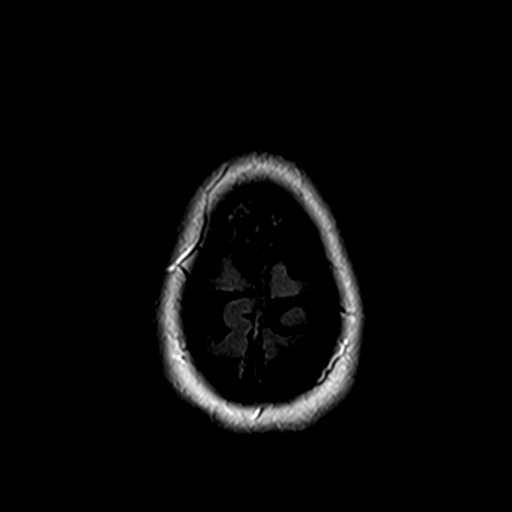

[Series 7: axial grad (blood) · axial · 5.0mm · 0.45mm/px · z∈[-49,+99]mm · 2 of 24 slices shown]
[im 1/24]
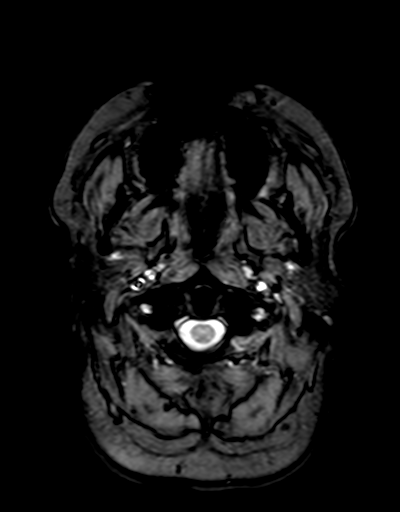
[im 24/24]
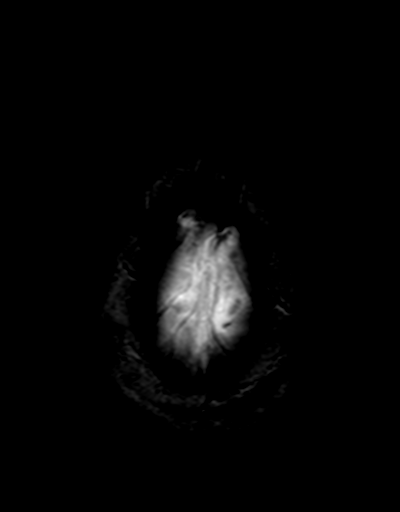

[Series 8: FLAIR · sagittal · 5.0mm · 0.45mm/px · 2 of 25 slices shown (2 of 2)]
[im 1/25]
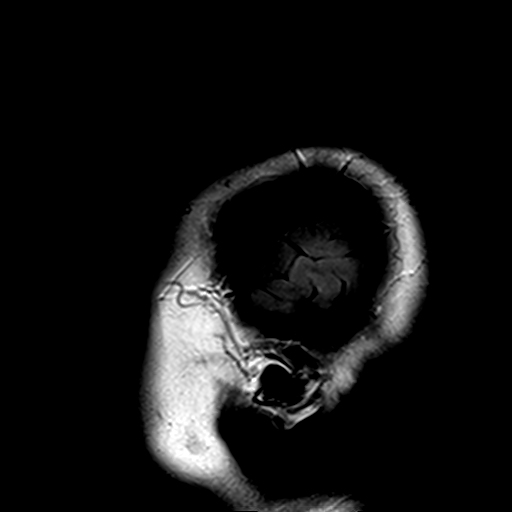
[im 25/25]
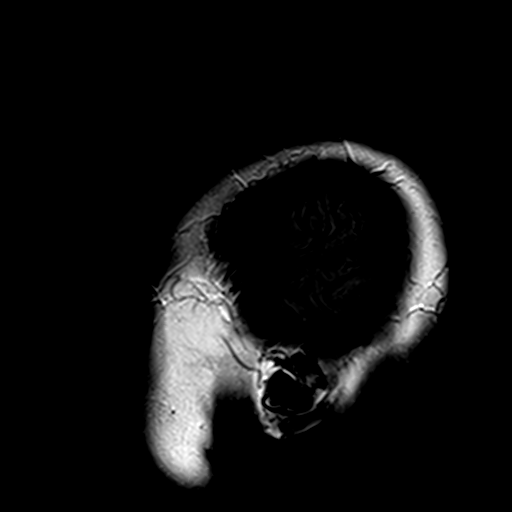

[Series 9: sag 3mm · sagittal · 3.0mm · 0.33mm/px · 1 of 12 slices shown]
[im 1/12]
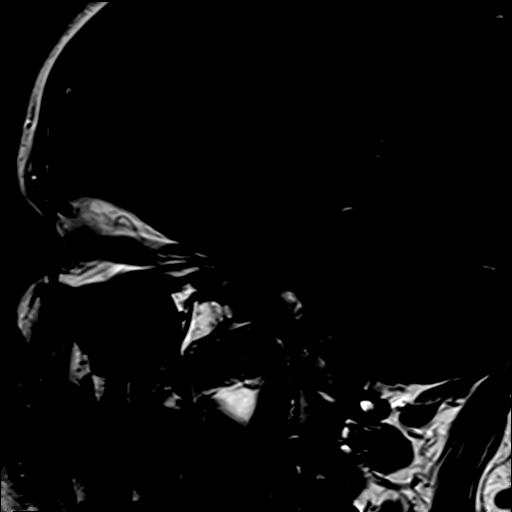

[Series 10: cor 3mm · coronal · 3.0mm · 0.33mm/px · 1 of 13 slices shown]
[im 1/13]
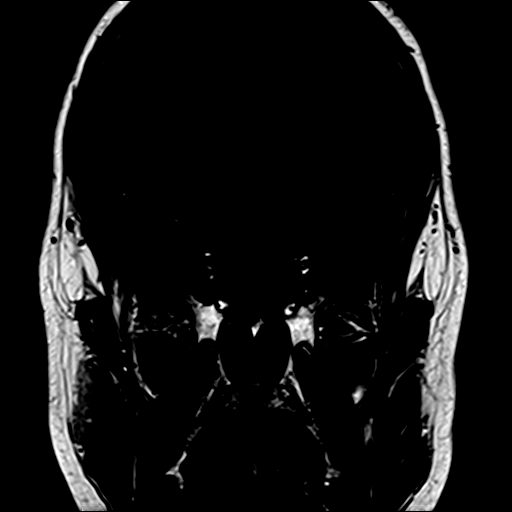

[Series 11: pre cor dynamic · coronal · non-contrast · 3.0mm · 0.35mm/px · 1 of 13 slices shown]
[im 1/13]
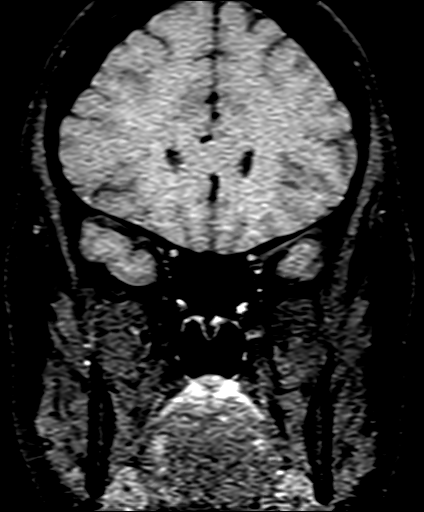

[Series 12: post fs cor · coronal · 3.0mm · 0.35mm/px · 1 of 13 slices shown (1 of 4)]
[im 1/13]
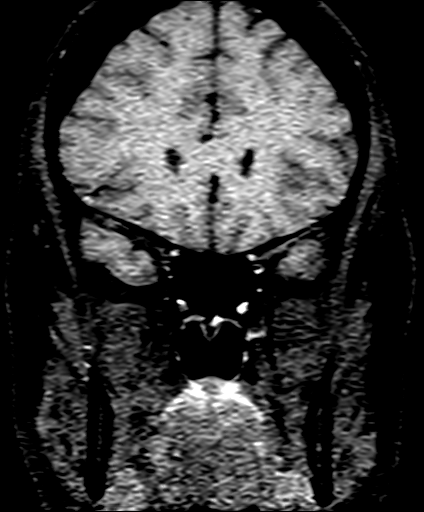

[Series 13: post fs cor · coronal · 3.0mm · 0.35mm/px · 1 of 13 slices shown (2 of 4)]
[im 1/13]
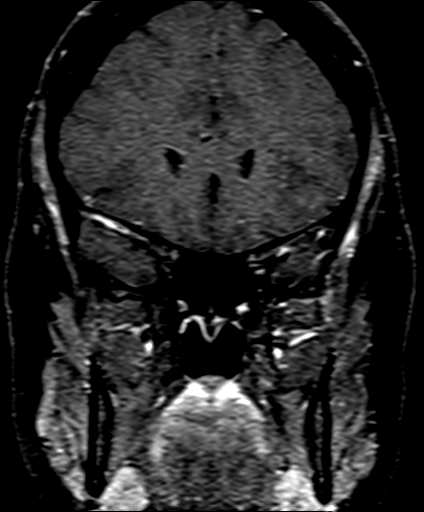

[Series 14: post fs cor · coronal · 3.0mm · 0.35mm/px · 1 of 13 slices shown (3 of 4)]
[im 1/13]
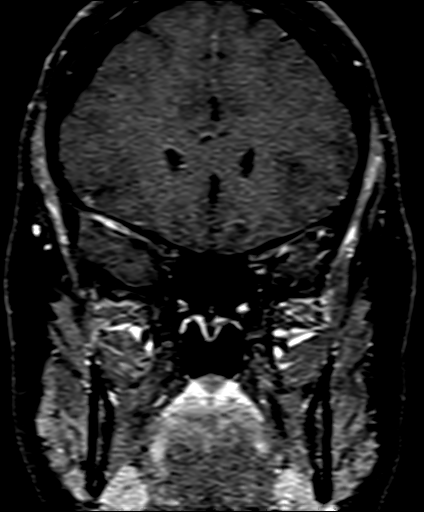

[Series 15: post fs cor · coronal · 3.0mm · 0.35mm/px · 1 of 13 slices shown (4 of 4)]
[im 1/13]
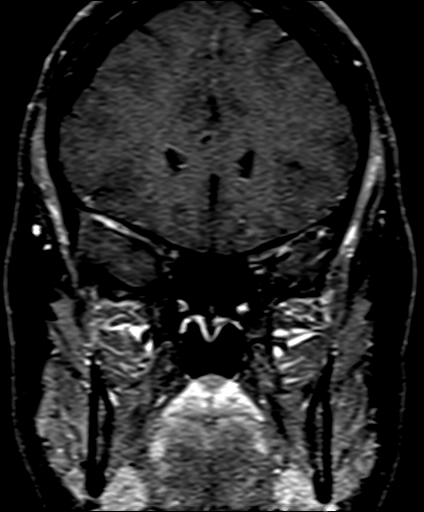

[32 of 48 positions shown; findings below may reference images not displayed]

FINDINGS: Brain: The pituitary has normal size and shape with homogeneous
enhancement. Unremarkable infundibulum. Clear suprasellar cistern.

Occasional FLAIR hyperintensity in the cerebral white matter from
nonspecific remote insult. Normal brain volume. No infarct,
hemorrhage, hydrocephalus, or collection.

Vascular: Major flow voids and vascular enhancements are preserved

Skull and upper cervical spine: No evidence of marrow lesion

Sinuses/Orbits: Negative
IMPRESSION: Normal MRI pituitary.

## 2019-10-22 ENCOUNTER — Encounter: Payer: Self-pay | Admitting: Certified Nurse Midwife

## 2019-11-09 ENCOUNTER — Telehealth: Payer: Self-pay | Admitting: *Deleted

## 2019-11-09 NOTE — Telephone Encounter (Signed)
Call to patient. 06 recall in for 09/2019. Patient scheduled for 6 month pap with Dr. Oscar La on 11-30-19 at 1100. Patient agreeable to date and time of appointment.   Routing to provider and will close encounter.

## 2019-11-30 ENCOUNTER — Ambulatory Visit: Payer: BC Managed Care – PPO | Admitting: Obstetrics and Gynecology

## 2019-12-03 NOTE — Progress Notes (Deleted)
GYNECOLOGY  VISIT   HPI: 44 y.o.   Single White or Caucasian Not Hispanic or Latino  female   G1P0010 with No LMP recorded.   here for   6 month pap smear  GYNECOLOGIC HISTORY: No LMP recorded. Contraception: abstinence  Menopausal hormone therapy: none         OB History    Gravida  1   Para  0   Term  0   Preterm  0   AB  1   Living  0     SAB  0   TAB  0   Ectopic  0   Multiple  0   Live Births                 Patient Active Problem List   Diagnosis Date Noted  . Anemia 03/17/2019  . DDD (degenerative disc disease), lumbar 03/17/2019  . Insomnia 03/17/2019  . Migraines 03/17/2019  . Chronic migraine without aura without status migrainosus, not intractable 02/09/2019  . OSA (obstructive sleep apnea) - not on cpap 02/09/2019  . Disturbance in sleep behavior 06/26/2018  . Acquired hypothyroidism 06/26/2018  . Female hirsutism 06/15/2018  . Hypercortisolemia 05/15/2018  . Carpal tunnel syndrome 12/10/2017  . Pain in right hand 12/10/2017  . Obesity, unspecified 07/11/2015  . Bipolar 1 disorder (HCC) 11/12/2012  . Polycystic ovarian syndrome 11/12/2012  . Unspecified hypothyroidism 11/12/2012    Past Medical History:  Diagnosis Date  . Abnormal Pap smear of cervix    yrs ago, possible biopsy also  . ADHD (attention deficit hyperactivity disorder) 1990  . Anemia   . Anxiety   . Bipolar affective disorder (HCC)   . Carpal tunnel syndrome, bilateral   . Depression   . Elevated cortisol level   . PCOS (polycystic ovarian syndrome)   . Polycystic ovarian syndrome   . Sleep apnea   . Thyroid disease   . Vertigo     Past Surgical History:  Procedure Laterality Date  . CARPAL TUNNEL RELEASE  2010   Left only  . CO2 LASER APPLICATION  2014  . EYE SURGERY     Lasic  . GUM SURGERY  ~2008    Current Outpatient Medications  Medication Sig Dispense Refill  . Acetaminophen (TYLENOL PO) Take by mouth as needed.    . ALPRAZolam (XANAX) 1 MG  tablet Take 1 mg by mouth at bedtime as needed for sleep.    [name] aspirin-acetaminophen-caffeine (EXCEDRIN MIGRAINE) 250-250-65 MG tablet Take by mouth every 6 (six) hours as needed for headache.    . Cholecalciferol (VITAMIN D PO) Take 2,000 Units by mouth daily.     . diphenhydrAMINE HCl (BENADRYL PO) Take by mouth.    . Fremanezumab-vfrm (AJOVY) 225 MG/1.5ML SOAJ Inject 225 mg into the skin every 30 (thirty) days. 1 pen 11  . ibuprofen (ADVIL) 800 MG tablet Take 1 tablet (800 mg total) by mouth every 8 (eight) hours as needed. 30 tablet 1  . lamoTRIgine (LAMICTAL) 150 MG tablet Take 150 mg by mouth daily.    [name] lisdexamfetamine (VYVANSE) 70 MG capsule Take by mouth.    . loratadine (CLARITIN) 10 MG tablet Take 10 mg by mouth daily.    . meclizine (ANTIVERT) 25 MG tablet Take 25 mg by mouth as needed for dizziness.    . metFORMIN (GLUCOPHAGE) 500 MG tablet TAKE ONE TABLET TWICE DAILY WITH A MEAL 60 tablet 0  . SAXENDA 18 MG/3ML SOPN Inject 3 mg into the  skin daily.     [name] spironolactone (ALDACTONE) 50 MG tablet Take 50 mg by mouth daily.    [name] SYNTHROID 88 MCG tablet     . venlafaxine XR (EFFEXOR-XR) 75 MG 24 hr capsule Take 75 mg by mouth daily with breakfast.    . Vitamin D, Ergocalciferol, (DRISDOL) 50000 UNITS CAPS capsule Take 1 capsule by mouth once a week.     No current facility-administered medications for this visit.     ALLERGIES: Patient has no known allergies.  Family History  Problem Relation Age of Onset  . Cancer Mother 29       melanoma  . Migraines Mother   . Hypertension Father   . Hyperlipidemia Father   . Migraines Sister   . Cancer Maternal Grandmother   . Heart disease Paternal Grandmother   . Diabetes Paternal Grandmother   . Cancer Paternal Grandmother 38       GYN cancer x 2, uterine (either ovarian or cervical)    Social History   Socioeconomic History  . Marital status: Single    Spouse name: Not on file  . Number of children: 0  . Years of  education: Not on file  . Highest education level: Not on file  Occupational History  . Not on file  Tobacco Use  . Smoking status: Former Smoker    Types: Cigarettes  . Smokeless tobacco: Never Used  . Tobacco comment: smoking off and on with a drink since 15. Stopped muliple times.   Substance and Sexual Activity  . Alcohol use: Yes    Alcohol/week: 3.0 standard drinks    Types: 3 Standard drinks or equivalent per week  . Drug use: No  . Sexual activity: Not Currently    Birth control/protection: Abstinence  Other Topics Concern  . Not on file  Social History Narrative   Lives at home with her 2 dogs   Right handed   Caffeine: coffee in the morning, tea during the day. About 2 cups/day   Social Determinants of Health   Financial Resource Strain:   . Difficulty of Paying Living Expenses:   Food Insecurity:   . Worried About Charity fundraiser in the Last Year:   . Arboriculturist in the Last Year:   Transportation Needs:   . Film/video editor (Medical):   [name] Lack of Transportation (Non-Medical):   Physical Activity:   . Days of Exercise per Week:   . Minutes of Exercise per Session:   Stress:   . Feeling of Stress :   Social Connections:   . Frequency of Communication with Friends and Family:   . Frequency of Social Gatherings with Friends and Family:   . Attends Religious Services:   . Active Member of Clubs or Organizations:   . Attends Archivist Meetings:   [name] Marital Status:   Intimate Partner Violence:   . Fear of Current or Ex-Partner:   . Emotionally Abused:   [name] Physically Abused:   . Sexually Abused:     Review of Systems  Constitutional: Negative.   HENT: Negative.   Eyes: Negative.   Respiratory: Negative.   Cardiovascular: Negative.   Gastrointestinal: Negative.   Genitourinary: Negative.   Musculoskeletal: Negative.   Skin: Negative.   Neurological: Negative.   Endo/Heme/Allergies: Negative.   Psychiatric/Behavioral:  Negative.     PHYSICAL EXAMINATION:    There were no vitals taken for this visit.    General appearance: alert, cooperative and  appears stated age Neck: no adenopathy, supple, symmetrical, trachea midline and thyroid {CHL AMB PHY EX THYROID NORM [phone]::"normal to inspection and palpation"} Breasts: {Exam; breast:13139::"normal appearance, no masses or tenderness"} Abdomen: soft, non-tender; non distended, no masses,  no organomegaly  Pelvic: External genitalia:  no lesions              Urethra:  normal appearing urethra with no masses, tenderness or lesions              Bartholins and Skenes: normal                 Vagina: normal appearing vagina with normal color and discharge, no lesions              Cervix: {CHL AMB PHY EX CERVIX NORM [phone]::"no lesions"}              Bimanual Exam:  Uterus:  {CHL AMB PHY EX UTERUS NORM DEFAULT:[phone]::"normal size, contour, position, consistency, mobility, non-tender"}              Adnexa: {CHL AMB PHY EX ADNEXA NO MASS DEFAULT:[phone]::"no mass, fullness, tenderness"}              Rectovaginal: {yes no:314532}.  Confirms.              Anus:  normal sphincter tone, no lesions  Chaperone was present for exam.  ASSESSMENT     PLAN    An After Visit Summary was printed and given to the patient.  *** minutes face to face time of which over 50% was spent in counseling.

## 2019-12-06 ENCOUNTER — Encounter: Payer: Self-pay | Admitting: Obstetrics and Gynecology

## 2019-12-06 ENCOUNTER — Ambulatory Visit: Payer: BC Managed Care – PPO | Admitting: Obstetrics and Gynecology

## 2020-06-19 IMAGING — US ULTRASOUND ABDOMEN COMPLETE
1 series · 14 of 25 positions shown · non-contrast
Comparison: None.

CLINICAL DATA: Abdominal pain for 3 months.

EXAM:
ABDOMEN ULTRASOUND COMPLETE

[Series 1: ultrasound abdomen complete · 0.17mm/px · 14 of 91 slices shown]
[im 1/91]
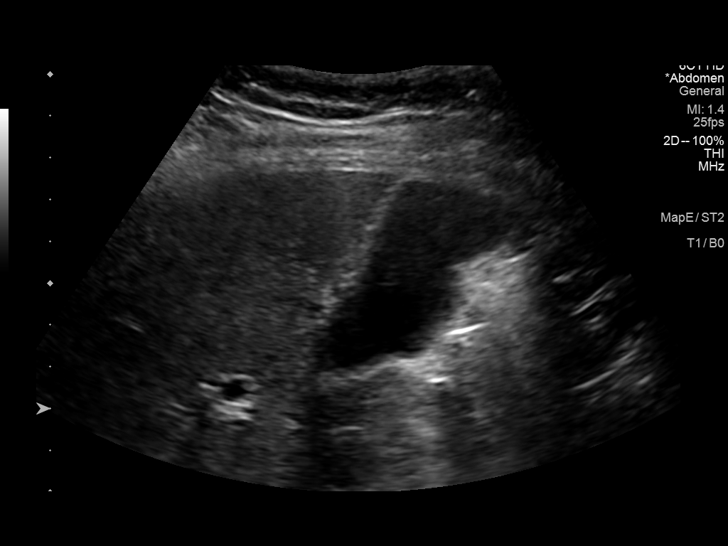
[im 8/91]
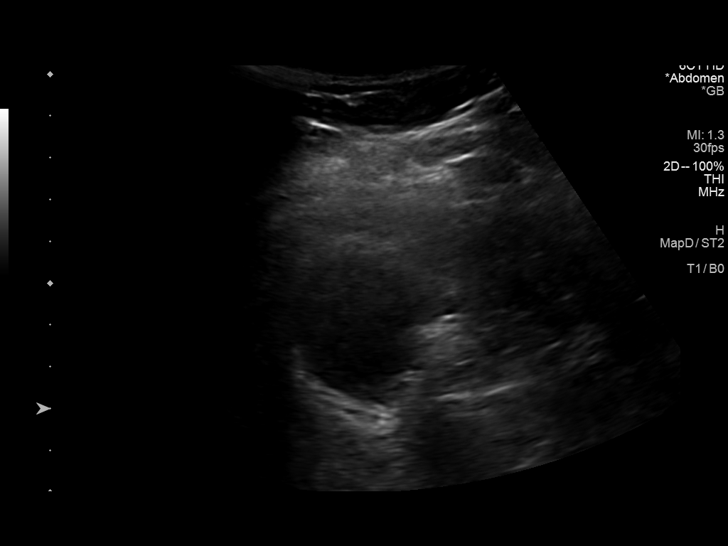
[im 16/91]
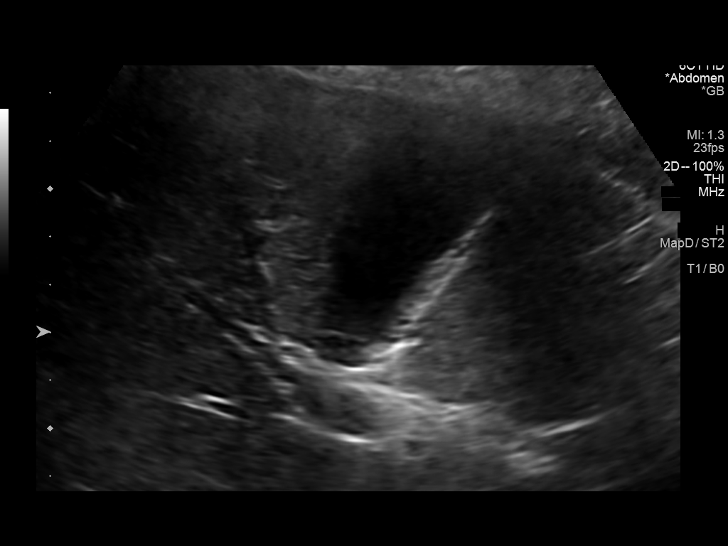
[im 23/91]
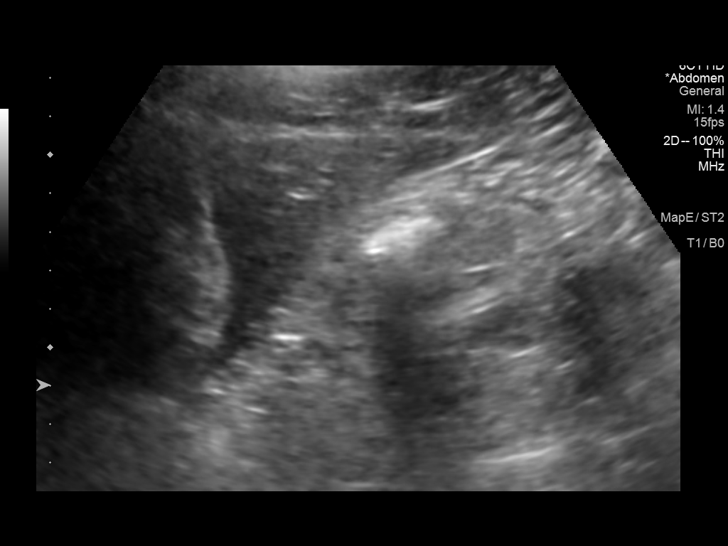
[im 31/91]
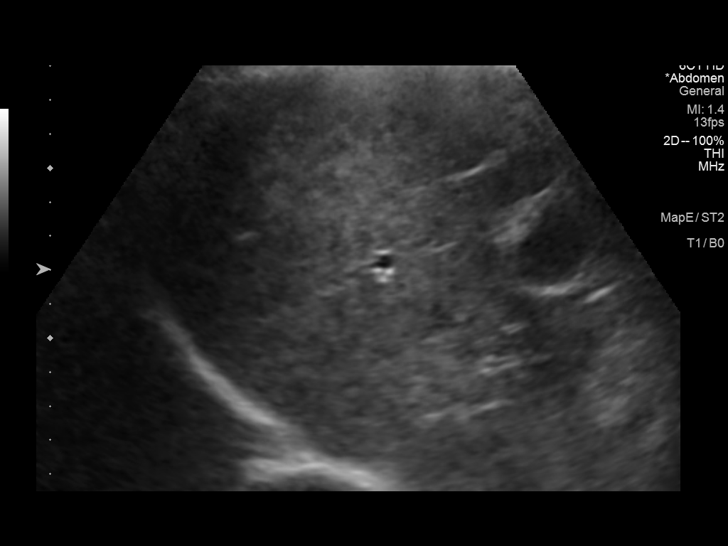
[im 34/91]
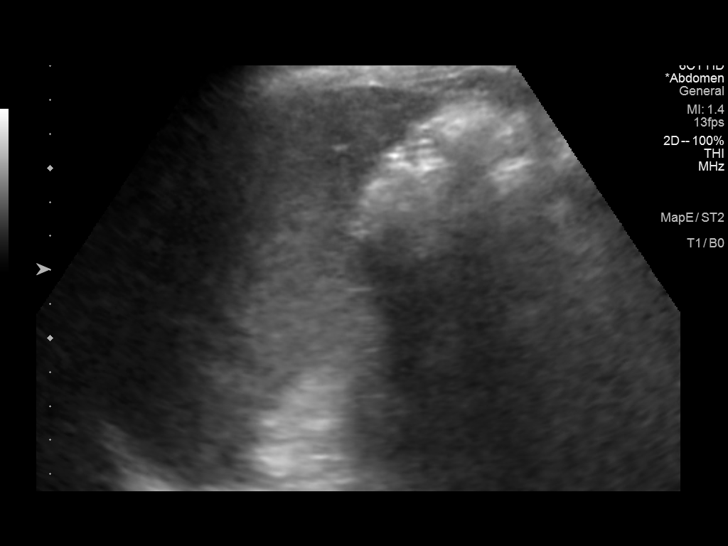
[im 42/91]
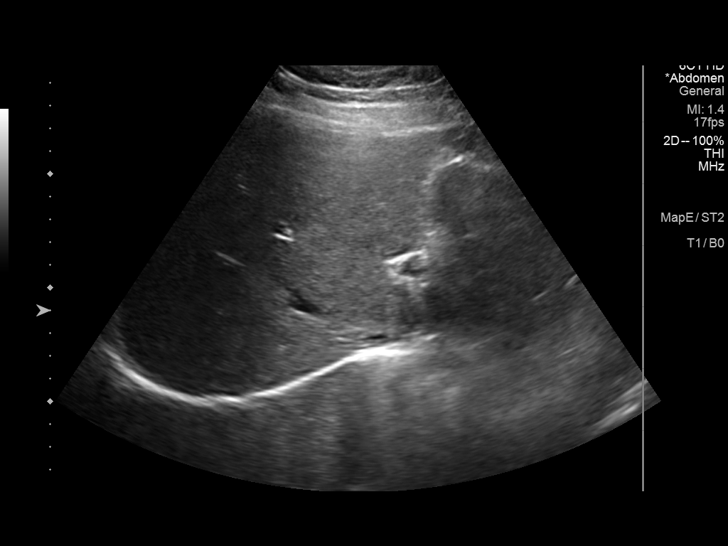
[im 49/91]
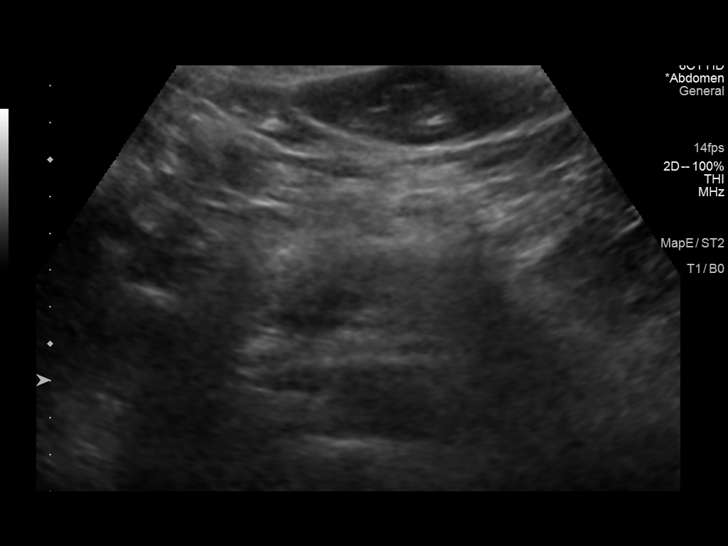
[im 57/91]
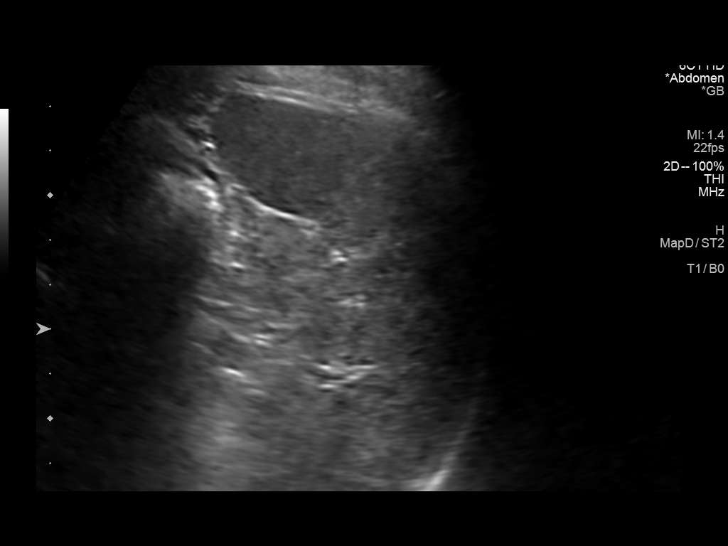
[im 61/91]
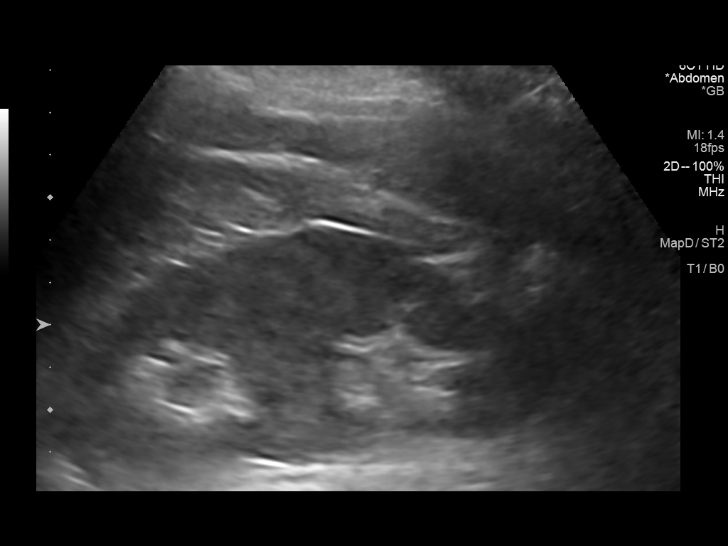
[im 68/91]
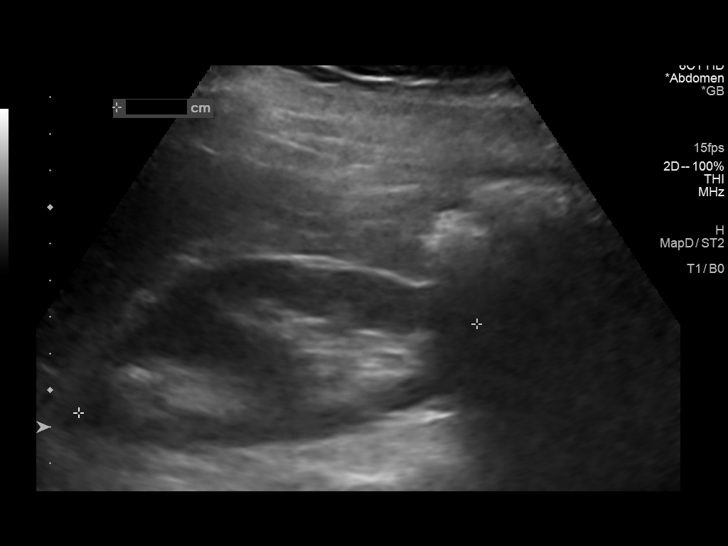
[im 76/91]
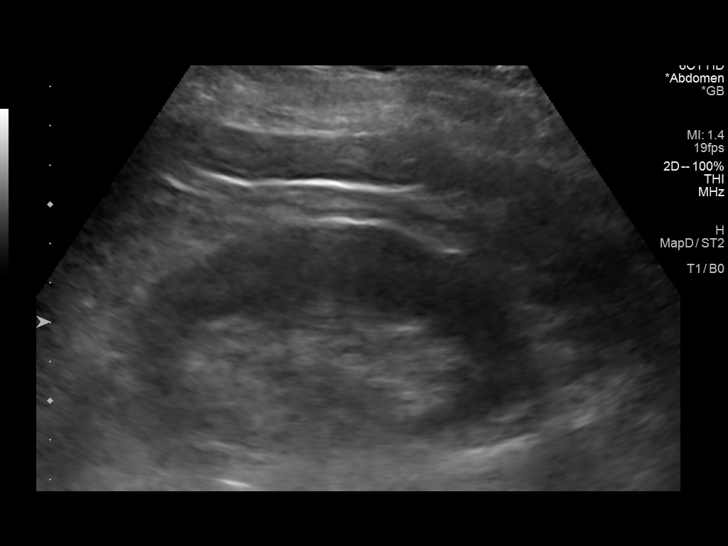
[im 83/91]
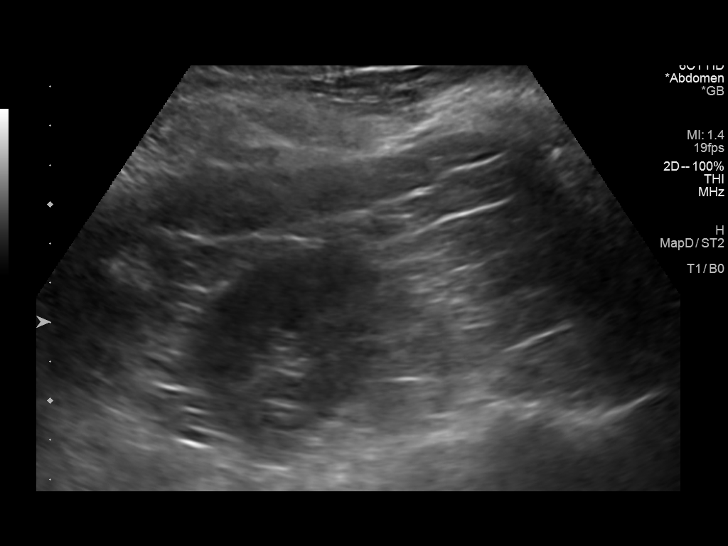
[im 91/91]
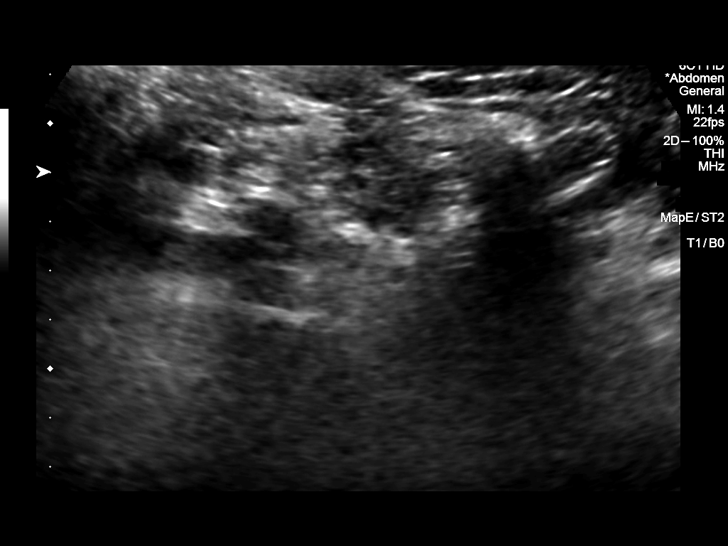

[14 of 25 positions shown; findings below may reference images not displayed]

FINDINGS: Gallbladder: No gallstones or wall thickening visualized. No
sonographic Murphy sign noted by sonographer.

Common bile duct: Diameter: 3 mm

Liver: No focal lesion identified. Within normal limits in
parenchymal echogenicity. Portal vein is patent on color Doppler
imaging with normal direction of blood flow towards the liver.

IVC: No abnormality visualized.

Pancreas: Visualized portion unremarkable.

Spleen: Size and appearance within normal limits.

Right Kidney: Length: 11.3 cm. Echogenicity within normal limits. No
mass or hydronephrosis visualized.

Left Kidney: Length: 11.2 cm. Echogenicity within normal limits. No
mass or hydronephrosis visualized.

Abdominal aorta: No aneurysm visualized.

Other findings: None.
IMPRESSION: Normal abdominal ultrasound.

## 2020-11-27 ENCOUNTER — Other Ambulatory Visit: Payer: Self-pay | Admitting: Internal Medicine

## 2020-11-27 DIAGNOSIS — R5381 Other malaise: Secondary | ICD-10-CM

## 2020-11-30 ENCOUNTER — Other Ambulatory Visit: Payer: Self-pay | Admitting: Internal Medicine

## 2020-11-30 DIAGNOSIS — N644 Mastodynia: Secondary | ICD-10-CM

## 2020-12-19 ENCOUNTER — Other Ambulatory Visit: Payer: Self-pay | Admitting: Endocrinology

## 2020-12-19 DIAGNOSIS — E039 Hypothyroidism, unspecified: Secondary | ICD-10-CM

## 2020-12-21 ENCOUNTER — Ambulatory Visit
Admission: RE | Admit: 2020-12-21 | Discharge: 2020-12-21 | Disposition: A | Discharge status: Home / Self Care | Payer: BC Managed Care – PPO | Source: Ambulatory Visit | Attending: Endocrinology | Admitting: Endocrinology

## 2020-12-21 DIAGNOSIS — E039 Hypothyroidism, unspecified: Secondary | ICD-10-CM

## 2020-12-22 ENCOUNTER — Other Ambulatory Visit: Payer: Self-pay

## 2020-12-22 ENCOUNTER — Ambulatory Visit
Admission: RE | Admit: 2020-12-22 | Discharge: 2020-12-22 | Disposition: A | Discharge status: Home / Self Care | Payer: BC Managed Care – PPO | Source: Ambulatory Visit | Attending: Internal Medicine | Admitting: Internal Medicine

## 2020-12-22 DIAGNOSIS — N644 Mastodynia: Secondary | ICD-10-CM

## 2021-11-28 ENCOUNTER — Other Ambulatory Visit: Payer: Self-pay | Admitting: Internal Medicine

## 2021-11-28 DIAGNOSIS — Z1231 Encounter for screening mammogram for malignant neoplasm of breast: Secondary | ICD-10-CM

## 2022-01-08 NOTE — Progress Notes (Deleted)
46 y.o. G1P0010 Single White or Caucasian Not Hispanic or Latino female here for annual exam.      No LMP recorded.          Sexually active: {yes no:314532}  The current method of family planning is {contraception:315051}.    Exercising: {yes no:314532}  {types:19826} Smoker:  {YES NO:22349}  Health Maintenance: Pap:  03-17-19 neg HPV HR neg ( cellular changes consistent with hyperkeratosis, 12-22-14 neg History of abnormal Pap:  {YES NO:22349} MMG:  bilateral 12-22-20, left & rt breast u/s category c density birads 1:neg BMD:   none Colonoscopy: none TDaP:  2010? Was to check with dr Timothy Lasso Gardasil: ***   reports that she has quit smoking. Her smoking use included cigarettes. She has never used smokeless tobacco. She reports current alcohol use of about 3.0 standard drinks per week. She reports that she does not use drugs.  Past Medical History:  Diagnosis Date   Abnormal Pap smear of cervix    yrs ago, possible biopsy also   ADHD (attention deficit hyperactivity disorder) 1990   Anemia    Anxiety    Bipolar affective disorder (HCC)    Carpal tunnel syndrome, bilateral    Depression    Elevated cortisol level    PCOS (polycystic ovarian syndrome)    Polycystic ovarian syndrome    Sleep apnea    Thyroid disease    Vertigo     Past Surgical History:  Procedure Laterality Date   CARPAL TUNNEL RELEASE  2010   Left only   CO2 LASER APPLICATION  2014   EYE SURGERY     Lasic   GUM SURGERY  ~2008    Current Outpatient Medications  Medication Sig Dispense Refill   Acetaminophen (TYLENOL PO) Take by mouth as needed.     ALPRAZolam (XANAX) 1 MG tablet Take 1 mg by mouth at bedtime as needed for sleep.     aspirin-acetaminophen-caffeine (EXCEDRIN MIGRAINE) 250-250-65 MG tablet Take by mouth every 6 (six) hours as needed for headache.     Cholecalciferol (VITAMIN D PO) Take 2,000 Units by mouth daily.      diphenhydrAMINE HCl (BENADRYL PO) Take by mouth.     Fremanezumab-vfrm  (AJOVY) 225 MG/1.5ML SOAJ Inject 225 mg into the skin every 30 (thirty) days. 1 pen 11   ibuprofen (ADVIL) 800 MG tablet Take 1 tablet (800 mg total) by mouth every 8 (eight) hours as needed. 30 tablet 1   lamoTRIgine (LAMICTAL) 150 MG tablet Take 150 mg by mouth daily.     lisdexamfetamine (VYVANSE) 70 MG capsule Take by mouth.     loratadine (CLARITIN) 10 MG tablet Take 10 mg by mouth daily.     meclizine (ANTIVERT) 25 MG tablet Take 25 mg by mouth as needed for dizziness.     metFORMIN (GLUCOPHAGE) 500 MG tablet TAKE ONE TABLET TWICE DAILY WITH A MEAL 60 tablet 0   SAXENDA 18 MG/3ML SOPN Inject 3 mg into the skin daily.      spironolactone (ALDACTONE) 50 MG tablet Take 50 mg by mouth daily.     SYNTHROID 88 MCG tablet      venlafaxine XR (EFFEXOR-XR) 75 MG 24 hr capsule Take 75 mg by mouth daily with breakfast.     Vitamin D, Ergocalciferol, (DRISDOL) 50000 UNITS CAPS capsule Take 1 capsule by mouth once a week.     No current facility-administered medications for this visit.    Family History  Problem Relation Age of Onset  Cancer Mother 98       melanoma   Migraines Mother    Hypertension Father    Hyperlipidemia Father    Migraines Sister    Cancer Maternal Grandmother    Heart disease Paternal Grandmother    Diabetes Paternal Grandmother    Cancer Paternal Grandmother 25       GYN cancer x 2, uterine (either ovarian or cervical)    Review of Systems  Exam:   There were no vitals taken for this visit.  Weight change: @WEIGHTCHANGE @ Height:      Ht Readings from Last 3 Encounters:  03/17/19 5' 4.5" (1.638 m)  03/04/19 5\' 4"  (1.626 m)  02/09/19 5\' 5"  (1.651 m)    General appearance: alert, cooperative and appears stated age Head: Normocephalic, without obvious abnormality, atraumatic Neck: no adenopathy, supple, symmetrical, trachea midline and thyroid {CHL AMB PHY EX THYROID NORM DEFAULT:[phone]::"normal to inspection and palpation"} Lungs: clear to auscultation  bilaterally Cardiovascular: regular rate and rhythm Breasts: {Exam; breast:13139::"normal appearance, no masses or tenderness"} Abdomen: soft, non-tender; non distended,  no masses,  no organomegaly Extremities: extremities normal, atraumatic, no cyanosis or edema Skin: Skin color, texture, turgor normal. No rashes or lesions Lymph nodes: Cervical, supraclavicular, and axillary nodes normal. No abnormal inguinal nodes palpated Neurologic: Grossly normal   Pelvic: External genitalia:  no lesions              Urethra:  normal appearing urethra with no masses, tenderness or lesions              Bartholins and Skenes: normal                 Vagina: normal appearing vagina with normal color and discharge, no lesions              Cervix: {CHL AMB PHY EX CERVIX NORM DEFAULT:[phone]::"no lesions"}               Bimanual Exam:  Uterus:  {CHL AMB PHY EX UTERUS NORM DEFAULT:[phone]::"normal size, contour, position, consistency, mobility, non-tender"}              Adnexa: {CHL AMB PHY EX ADNEXA NO MASS DEFAULT:[phone]::"no mass, fullness, tenderness"}               Rectovaginal: Confirms               Anus:  normal sphincter tone, no lesions  *** chaperoned for the exam.  A:  Well Woman with normal exam  P:

## 2022-01-09 ENCOUNTER — Encounter: Payer: BC Managed Care – PPO | Admitting: Obstetrics and Gynecology

## 2022-01-09 ENCOUNTER — Ambulatory Visit: Payer: BC Managed Care – PPO | Admitting: Obstetrics and Gynecology

## 2022-01-22 ENCOUNTER — Ambulatory Visit: Payer: BC Managed Care – PPO | Admitting: Obstetrics and Gynecology

## 2022-01-22 ENCOUNTER — Ambulatory Visit (INDEPENDENT_AMBULATORY_CARE_PROVIDER_SITE_OTHER): Payer: BC Managed Care – PPO

## 2022-01-22 ENCOUNTER — Encounter: Payer: Self-pay | Admitting: Obstetrics and Gynecology

## 2022-01-22 ENCOUNTER — Telehealth: Payer: Self-pay | Admitting: Obstetrics and Gynecology

## 2022-01-22 ENCOUNTER — Other Ambulatory Visit (HOSPITAL_COMMUNITY)
Admission: RE | Admit: 2022-01-22 | Discharge: 2022-01-22 | Disposition: A | Discharge status: Home / Self Care | Payer: BC Managed Care – PPO | Source: Ambulatory Visit | Attending: Obstetrics and Gynecology | Admitting: Obstetrics and Gynecology

## 2022-01-22 VITALS — BP 100/70 | HR 95 | Ht 65.0 in | Wt 194.0 lb

## 2022-01-22 DIAGNOSIS — R102 Pelvic and perineal pain: Secondary | ICD-10-CM

## 2022-01-22 DIAGNOSIS — Z1211 Encounter for screening for malignant neoplasm of colon: Secondary | ICD-10-CM

## 2022-01-22 DIAGNOSIS — E039 Hypothyroidism, unspecified: Secondary | ICD-10-CM

## 2022-01-22 DIAGNOSIS — E282 Polycystic ovarian syndrome: Secondary | ICD-10-CM

## 2022-01-22 DIAGNOSIS — R109 Unspecified abdominal pain: Secondary | ICD-10-CM

## 2022-01-22 DIAGNOSIS — Z01419 Encounter for gynecological examination (general) (routine) without abnormal findings: Secondary | ICD-10-CM | POA: Diagnosis not present

## 2022-01-22 DIAGNOSIS — R519 Headache, unspecified: Secondary | ICD-10-CM | POA: Diagnosis not present

## 2022-01-22 DIAGNOSIS — N920 Excessive and frequent menstruation with regular cycle: Secondary | ICD-10-CM | POA: Diagnosis not present

## 2022-01-22 DIAGNOSIS — Z124 Encounter for screening for malignant neoplasm of cervix: Secondary | ICD-10-CM | POA: Diagnosis present

## 2022-01-22 DIAGNOSIS — R42 Dizziness and giddiness: Secondary | ICD-10-CM

## 2022-01-22 DIAGNOSIS — E249 Cushing's syndrome, unspecified: Secondary | ICD-10-CM

## 2022-01-22 NOTE — Progress Notes (Signed)
46 y.o. G3P0010 Single White or Caucasian Not Hispanic or Latino female here for annual exam and lower left abdominal pain.  The LLQ pain started a months ago, initially intermittent and dull. Now it's constant, dull, with movement she has some sharp pain. Pain ranges from 3-4/10 in severity.   She is constipated. Small BM daily, doesn't feel she is emptying.   She has a pounding headache. She is light headed and dizzy as well.  Just not feeling well.  Typically cycles are monthly. Currently with light bleeding mid cycle. Not sexually active for years. Last 2 cycles have been heavier flow, consistency of the blood is thinner, hasn't had her typical clots. Saturating a super tampon in 2-3 hours.  Period Cycle (Days): 28 (She states that she started the 1st and then again on the 17th. She is just spotting.) Period Pattern: (!) Irregular Menstrual Flow: Moderate Menstrual Control: Tampon Menstrual Control Change Freq (Hours): 2 Dysmenorrhea: (!) Moderate Dysmenorrhea Symptoms: Cramping  Followed by Endocrinology for Cushing like syndrome, PCOS, hypothyroidism  Working on weight loss. Considering weight loss surgery  Patient's last menstrual period was 01/19/2022.          Sexually active: No.  The current method of family planning is abstinence.    Exercising: No.  The patient does not participate in regular exercise at present. Smoker:  no  Health Maintenance: Pap:  03/17/2019 hyperkeratosis HR HPV Neg  History of abnormal Pap:  yes had colpo  MMG:  12/22/20 Bi-rads 1 neg  BMD:   none  Colonoscopy: none  TDaP:  08/05/2008  Gardasil: none    reports that she has quit smoking. Her smoking use included cigarettes. She has never used smokeless tobacco. She reports current alcohol use of about 3.0 standard drinks of alcohol per week. She reports that she does not use drugs. Works for a Building services engineer.   Past Medical History:  Diagnosis Date   Abnormal Pap smear of cervix    yrs ago, possible  biopsy also   Abnormal uterine bleeding    ADHD (attention deficit hyperactivity disorder) 1990   Anemia    Anxiety    Bipolar affective disorder (HCC)    Carpal tunnel syndrome, bilateral    Depression    Dysmenorrhea    Elevated cortisol level    PCOS (polycystic ovarian syndrome)    Polycystic ovarian syndrome    Sleep apnea    STD (sexually transmitted disease)    HPV   Thyroid disease    Vertigo     Past Surgical History:  Procedure Laterality Date   CARPAL TUNNEL RELEASE  08/05/2008   Left only   CO2 LASER APPLICATION  08/05/2012   COLPOSCOPY     EYE SURGERY     Lasic   GUM SURGERY  ~2008    Current Outpatient Medications  Medication Sig Dispense Refill   Acetaminophen (TYLENOL PO) Take by mouth as needed.     ALPRAZolam (XANAX) 1 MG tablet Take 1 mg by mouth at bedtime as needed for sleep.     amphetamine-dextroamphetamine (ADDERALL XR) 20 MG 24 hr capsule 1 capsule in the morning Orally TID     aspirin-acetaminophen-caffeine (EXCEDRIN MIGRAINE) 250-250-65 MG tablet Take by mouth every 6 (six) hours as needed for headache.     Cholecalciferol (VITAMIN D PO) Take 2,000 Units by mouth daily.      diphenhydrAMINE HCl (BENADRYL PO) Take by mouth.     ibuprofen (ADVIL) 800 MG tablet Take 1 tablet (800  mg total) by mouth every 8 (eight) hours as needed. 30 tablet 1   lamoTRIgine (LAMICTAL) 150 MG tablet Take 150 mg by mouth daily.     loratadine (CLARITIN) 10 MG tablet Take 10 mg by mouth daily.     meclizine (ANTIVERT) 25 MG tablet Take 25 mg by mouth as needed for dizziness.     metFORMIN (GLUCOPHAGE) 500 MG tablet TAKE ONE TABLET TWICE DAILY WITH A MEAL 60 tablet 0   spironolactone (ALDACTONE) 50 MG tablet Take 50 mg by mouth daily.     SYNTHROID 88 MCG tablet      tirzepatide (MOUNJARO) 5 MG/0.5ML Pen Inject 5mg  q weekly Subcutaneous as directed for 30 days     venlafaxine XR (EFFEXOR-XR) 75 MG 24 hr capsule Take 75 mg by mouth daily with breakfast.     Vitamin D,  Ergocalciferol, (DRISDOL) 50000 UNITS CAPS capsule Take 1 capsule by mouth once a week.     No current facility-administered medications for this visit.    Family History  Problem Relation Age of Onset   Cancer Mother 76       melanoma   Migraines Mother    Hypertension Father    Hyperlipidemia Father    Migraines Sister    Cancer Maternal Grandmother    Ovarian cancer Paternal Grandmother    Heart disease Paternal Grandmother    Diabetes Paternal Grandmother    Cancer Paternal Grandmother 102       GYN cancer x 2, uterine (either ovarian or cervical)    Review of Systems  Gastrointestinal:  Positive for abdominal pain and constipation.  Genitourinary:  Positive for menstrual problem.  Neurological:  Positive for dizziness, light-headedness and headaches.    Exam:   BP 100/70   Pulse 95   Ht 5\' 5"  (1.651 m)   Wt 194 lb (88 kg)   LMP 01/19/2022   SpO2 100%   BMI 32.28 kg/m   Weight change: @WEIGHTCHANGE @ Height:   Height: 5\' 5"  (165.1 cm)  Ht Readings from Last 3 Encounters:  01/22/22 5\' 5"  (1.651 m)  03/17/19 5' 4.5" (1.638 m)  03/04/19 5\' 4"  (1.626 m)    General appearance: alert, cooperative and appears stated age Head: Normocephalic, without obvious abnormality, atraumatic Neck: no adenopathy, supple, symmetrical, trachea midline and thyroid normal to inspection and palpation Lungs: clear to auscultation bilaterally Cardiovascular: regular rate and rhythm Breasts: normal appearance, no masses or tenderness Abdomen: soft, tender in the lower left lower quadrant, less tender with palpation over tensed abdominal muscles. Non distended,  no masses,  no organomegaly Extremities: extremities normal, atraumatic, no cyanosis or edema Skin: Skin color, texture, turgor normal. No rashes or lesions Lymph nodes: Cervical, supraclavicular, and axillary nodes normal. No abnormal inguinal nodes palpated Neurologic: Grossly normal   Pelvic: External genitalia:  no lesions               Urethra:  normal appearing urethra with no masses, tenderness or lesions              Bartholins and Skenes: normal                 Vagina: normal appearing vagina with normal color and discharge, no lesions              Cervix: no cervical motion tenderness and no lesions               Bimanual Exam:  Uterus:   no masses or tenderness  Adnexa:  no masses, tender in the left adnexa.                Rectovaginal: Confirms               Anus:  normal sphincter tone, no lesions  Kennon Portela, CMA chaperoned for the exam.  1. Well woman exam Discussed breast self exam Discussed calcium and vit D intake Mammogram due, she will schedule  2. Combined abdominal and pelvic pain Tender in LLQ/adnexa - US PELVIS TRANSVAGINAL NON-OB (TV ONLY); Future  3. Menorrhagia with regular cycle - CBC - TSH  4. Screening for cervical cancer - Cytology - PAP  5. Colon cancer screening - Ambulatory referral to Gastroenterology

## 2022-01-22 NOTE — Patient Instructions (Addendum)
Try miralax for constipation.   EXERCISE   We recommended that you start or continue a regular exercise program for good health. Physical activity is anything that gets your body moving, some is better than none. The CDC recommends 150 minutes per week of Moderate-Intensity Aerobic Activity and 2 or more days of Muscle Strengthening Activity.  Benefits of exercise are limitless: helps weight loss/weight maintenance, improves mood and energy, helps with depression and anxiety, improves sleep, tones and strengthens muscles, improves balance, improves bone density, protects from chronic conditions such as heart disease, high blood pressure and diabetes and so much more. To learn more visit: http://kirby-bean.org/  DIET: Good nutrition starts with a healthy diet of fruits, vegetables, whole grains, and lean protein sources. Drink plenty of water for hydration. Minimize empty calories, sodium, sweets. For more information about dietary recommendations visit: CriticalGas.be and https://www.carpenter-henry.info/  ALCOHOL:  Women should limit their alcohol intake to no more than 7 drinks/beers/glasses of wine (combined, not each!) per week. Moderation of alcohol intake to this level decreases your risk of breast cancer and liver damage.  If you are concerned that you may have a problem, or your friends have told you they are concerned about your drinking, there are many resources to help. A well-known program that is free, effective, and available to all people all over the nation is Alcoholics Anonymous.  Check out this site to learn more: BeverageBargains.co.za   CALCIUM AND VITAMIN D:  Adequate intake of calcium and Vitamin D are recommended for bone health.  You should be getting between 1000-1200 mg of calcium and 800 units of Vitamin D daily between diet and supplements  PAP SMEARS:  Pap smears, to check for cervical cancer or  precancers,  have traditionally been done yearly, scientific advances have shown that most women can have pap smears less often.  However, every woman still should have a physical exam from her gynecologist every year. It will include a breast check, inspection of the vulva and vagina to check for abnormal growths or skin changes, a visual exam of the cervix, and then an exam to evaluate the size and shape of the uterus and ovaries. We will also provide age appropriate advice regarding health maintenance, like when you should have certain vaccines, screening for sexually transmitted diseases, bone density testing, colonoscopy, mammograms, etc.   MAMMOGRAMS:  All women over 59 years old should have a routine mammogram.   COLON CANCER SCREENING: Now recommend starting at age 46. At this time colonoscopy is not covered for routine screening until 50. There are take home tests that can be done between 45-49.   COLONOSCOPY:  Colonoscopy to screen for colon cancer is recommended for all women at age 36.  We know, you hate the idea of the prep.  We agree, BUT, having colon cancer and not knowing it is worse!!  Colon cancer so often starts as a polyp that can be seen and removed at colonscopy, which can quite literally save your life!  And if your first colonoscopy is normal and you have no family history of colon cancer, most women don't have to have it again for 10 years.  Once every ten years, you can do something that may end up saving your life, right?  We will be happy to help you get it scheduled when you are ready.  Be sure to check your insurance coverage so you understand how much it will cost.  It may be covered as a preventative service at  no cost, but you should check your particular policy.      Breast Self-Awareness Breast self-awareness means being familiar with how your breasts look and feel. It involves checking your breasts regularly and reporting any changes to your health care  provider. Practicing breast self-awareness is important. A change in your breasts can be a sign of a serious medical problem. Being familiar with how your breasts look and feel allows you to find any problems early, when treatment is more likely to be successful. All women should practice breast self-awareness, including women who have had breast implants. How to do a breast self-exam One way to learn what is normal for your breasts and whether your breasts are changing is to do a breast self-exam. To do a breast self-exam: Look for Changes  Remove all the clothing above your waist. Stand in front of a mirror in a room with good lighting. Put your hands on your hips. Push your hands firmly downward. Compare your breasts in the mirror. Look for differences between them (asymmetry), such as: Differences in shape. Differences in size. Puckers, dips, and bumps in one breast and not the other. Look at each breast for changes in your skin, such as: Redness. Scaly areas. Look for changes in your nipples, such as: Discharge. Bleeding. Dimpling. Redness. A change in position. Feel for Changes Carefully feel your breasts for lumps and changes. It is best to do this while lying on your back on the floor and again while sitting or standing in the shower or tub with soapy water on your skin. Feel each breast in the following way: Place the arm on the side of the breast you are examining above your head. Feel your breast with the other hand. Start in the nipple area and make  inch (2 cm) overlapping circles to feel your breast. Use the pads of your three middle fingers to do this. Apply light pressure, then medium pressure, then firm pressure. The light pressure will allow you to feel the tissue closest to the skin. The medium pressure will allow you to feel the tissue that is a little deeper. The firm pressure will allow you to feel the tissue close to the ribs. Continue the overlapping circles,  moving downward over the breast until you feel your ribs below your breast. Move one finger-width toward the center of the body. Continue to use the  inch (2 cm) overlapping circles to feel your breast as you move slowly up toward your collarbone. Continue the up and down exam using all three pressures until you reach your armpit.  Write Down What You Find  Write down what is normal for each breast and any changes that you find. Keep a written record with breast changes or normal findings for each breast. By writing this information down, you do not need to depend only on memory for size, tenderness, or location. Write down where you are in your menstrual cycle, if you are still menstruating. If you are having trouble noticing differences in your breasts, do not get discouraged. With time you will become more familiar with the variations in your breasts and more comfortable with the exam. How often should I examine my breasts? Examine your breasts every month. If you are breastfeeding, the best time to examine your breasts is after a feeding or after using a breast pump. If you menstruate, the best time to examine your breasts is 5-7 days after your period is over. During your period, your breasts are   lumpier, and it may be more difficult to notice changes. When should I see my health care provider? See your health care provider if you notice: A change in shape or size of your breasts or nipples. A change in the skin of your breast or nipples, such as a reddened or scaly area. Unusual discharge from your nipples. A lump or thick area that was not there before. Pain in your breasts. Anything that concerns you.  

## 2022-01-22 NOTE — Telephone Encounter (Signed)
Please let the patient know that her pelvic ultrasound is normal. I would recommend that she f/u with her primary care provider

## 2022-01-23 LAB — CBC
HCT: 42.1 % (ref 35.0–45.0)
Hemoglobin: 13.8 g/dL (ref 11.7–15.5)
MCH: 28.2 pg (ref 27.0–33.0)
MCHC: 32.8 g/dL (ref 32.0–36.0)
MCV: 86.1 fL (ref 80.0–100.0)
MPV: 10 fL (ref 7.5–12.5)
Platelets: 424 10*3/uL — ABNORMAL HIGH (ref 140–400)
RBC: 4.89 10*6/uL (ref 3.80–5.10)
RDW: 13.4 % (ref 11.0–15.0)
WBC: 7.3 10*3/uL (ref 3.8–10.8)

## 2022-01-23 LAB — CYTOLOGY - PAP
Comment: NEGATIVE
Diagnosis: NEGATIVE
High risk HPV: NEGATIVE

## 2022-01-23 LAB — TSH: TSH: 0.24 mIU/L — ABNORMAL LOW

## 2022-01-23 NOTE — Telephone Encounter (Signed)
Left message for patient to call.

## 2022-01-29 NOTE — Telephone Encounter (Signed)
Pt reports her endocrinologist is Dr. Talmage Nap at [facility].

## 2022-01-29 NOTE — Telephone Encounter (Signed)
Patient read my chart message "Last read by [facility] "Zanne" at  2:54 PM on 01/29/2022."

## 2022-09-26 ENCOUNTER — Other Ambulatory Visit (HOSPITAL_COMMUNITY): Payer: Self-pay

## 2022-09-26 MED ORDER — MOUNJARO 15 MG/0.5ML ~~LOC~~ SOAJ
15.0000 mg | SUBCUTANEOUS | 3 refills | Status: AC
  Filled 2022-09-26: qty 2, 28d supply, fill #0

## 2022-09-30 ENCOUNTER — Other Ambulatory Visit (HOSPITAL_COMMUNITY): Payer: Self-pay

## 2022-10-17 ENCOUNTER — Other Ambulatory Visit (HOSPITAL_COMMUNITY): Payer: Self-pay

## 2022-10-28 ENCOUNTER — Other Ambulatory Visit (HOSPITAL_COMMUNITY): Payer: Self-pay

## 2023-01-24 ENCOUNTER — Encounter: Payer: Self-pay | Admitting: Internal Medicine

## 2023-02-12 ENCOUNTER — Encounter: Payer: Self-pay | Admitting: Internal Medicine

## 2023-02-12 ENCOUNTER — Ambulatory Visit (AMBULATORY_SURGERY_CENTER): Payer: BC Managed Care – PPO | Admitting: *Deleted

## 2023-02-12 VITALS — Ht 65.0 in | Wt 162.0 lb

## 2023-02-12 DIAGNOSIS — Z1211 Encounter for screening for malignant neoplasm of colon: Secondary | ICD-10-CM

## 2023-02-12 MED ORDER — NA SULFATE-K SULFATE-MG SULF 17.5-3.13-1.6 GM/177ML PO SOLN
1.0000 | Freq: Once | ORAL | 0 refills | Status: AC
Start: 2023-02-12 — End: 2023-02-12

## 2023-02-12 NOTE — Progress Notes (Signed)

## 2023-02-18 ENCOUNTER — Encounter: Payer: BC Managed Care – PPO | Admitting: Internal Medicine

## 2023-02-20 ENCOUNTER — Encounter: Payer: Self-pay | Admitting: Certified Registered Nurse Anesthetist

## 2023-02-21 ENCOUNTER — Encounter: Payer: Self-pay | Admitting: Internal Medicine

## 2023-02-21 ENCOUNTER — Ambulatory Visit (AMBULATORY_SURGERY_CENTER): Payer: BC Managed Care – PPO | Admitting: Internal Medicine

## 2023-02-21 VITALS — BP 122/62 | HR 75 | Temp 97.1°F | Resp 16 | Ht 65.0 in | Wt 162.0 lb

## 2023-02-21 DIAGNOSIS — Z1211 Encounter for screening for malignant neoplasm of colon: Secondary | ICD-10-CM | POA: Diagnosis not present

## 2023-02-21 MED ORDER — SODIUM CHLORIDE 0.9 % IV SOLN
500.0000 mL | INTRAVENOUS | Status: AC
Start: 2023-02-21 — End: ?

## 2023-02-21 NOTE — Progress Notes (Signed)
Patient states there have been no changes to medical or surgical history since time of pre-visit. 

## 2023-02-21 NOTE — Patient Instructions (Addendum)
Resume previous diet Continue present medications There were no colon polyps seen today!   You will need another screening colonoscopy in 10 years, you will receive a letter at that time when you are due for the procedure.    Please call us at [phone] if you have a change in bowel habits, change in family history of colo-rectal cancer, rectal bleeding or other GI concern before that time. Handouts/information given for hemorrhoids  YOU HAD AN ENDOSCOPIC PROCEDURE TODAY AT THE [facility]:   Refer to the procedure report that was given to you for any specific questions about what was found during the examination.  If the procedure report does not answer your questions, please call your gastroenterologist to clarify.  If you requested that your care partner not be given the details of your procedure findings, then the procedure report has been included in a sealed envelope for you to review at your convenience later.  YOU SHOULD EXPECT: Some feelings of bloating in the abdomen. Passage of more gas than usual.  Walking can help get rid of the air that was put into your GI tract during the procedure and reduce the bloating. If you had a lower endoscopy (such as a colonoscopy or flexible sigmoidoscopy) you may notice spotting of blood in your stool or on the toilet paper. If you underwent a bowel prep for your procedure, you may not have a normal bowel movement for a few days. Please Note:  You might notice some irritation and congestion in your nose or some drainage.  This is from the oxygen used during your procedure.  There is no need for concern and it should clear up in a day or so.  SYMPTOMS TO REPORT IMMEDIATELY:  Following lower endoscopy (colonoscopy):  Excessive amounts of blood in the stool  Significant tenderness or worsening of abdominal pains  Swelling of the abdomen that is new, acute  Fever of 100F or higher For urgent or emergent issues, a gastroenterologist can be  reached at any hour by calling [phone]. Do not use MyChart messaging for urgent concerns.   DIET:  We do recommend a small meal at first, but then you may proceed to your regular diet.  Drink plenty of fluids but you should avoid alcoholic beverages for 24 hours.  ACTIVITY:  You should plan to take it easy for the rest of today and you should NOT DRIVE or use heavy machinery until tomorrow (because of the sedation medicines used during the test).    FOLLOW UP: Our staff will call the number listed on your records the next business day following your procedure.  We will call around 7:15- 8:00 am to check on you and address any questions or concerns that you may have regarding the information given to you following your procedure. If we do not reach you, we will leave a message.     SIGNATURES/CONFIDENTIALITY: You and/or your care partner have signed paperwork which will be entered into your electronic medical record.  These signatures attest to the fact that that the information above on your After Visit Summary has been reviewed and is understood.  Full responsibility of the confidentiality of this discharge information lies with you and/or your care-partner. 

## 2023-02-21 NOTE — Progress Notes (Signed)
GASTROENTEROLOGY PROCEDURE H&P NOTE   Primary Care Physician: Creola Corn, MD    Reason for Procedure:   Colon cancer screening  Plan:    Colonoscopy  Patient is appropriate for endoscopic procedure(s) in the ambulatory ([facility]) setting.  The nature of the procedure, as well as the risks, benefits, and alternatives were carefully and thoroughly reviewed with the patient. Ample time for discussion and questions allowed. The patient understood, was satisfied, and agreed to proceed.     HPI: Lisa Whitney is a 47 y.o. female who presents for colonoscopy for colon cancer screening. Denies blood in stools, changes in bowel habits, or unintentional weight loss. Denies family history of colon cancer.  Past Medical History:  Diagnosis Date   Abnormal Pap smear of cervix    yrs ago, possible biopsy also   Abnormal uterine bleeding    ADHD (attention deficit hyperactivity disorder) 1990   Allergy    Anemia    Anxiety    Bipolar affective disorder (HCC)    Carpal tunnel syndrome, bilateral    Depression    Dysmenorrhea    Elevated cortisol level    PCOS (polycystic ovarian syndrome)    Polycystic ovarian syndrome    Sleep apnea    no longer per pt   STD (sexually transmitted disease)    HPV   Thyroid disease    Vertigo     Past Surgical History:  Procedure Laterality Date   BELPHAROPTOSIS REPAIR Bilateral    CARPAL TUNNEL RELEASE  08/05/2008   Left only   CO2 LASER APPLICATION  08/05/2012   COLPOSCOPY     EYE SURGERY     Lasic   GUM SURGERY  ~2008   UPPER GASTROINTESTINAL ENDOSCOPY     at [facility]    Prior to Admission medications   Medication Sig Start Date End Date Taking? Authorizing Provider  amphetamine-dextroamphetamine (ADDERALL) 20 MG tablet Take 20 mg by mouth 3 (three) times daily. 02/20/23  Yes [provider]  lamoTRIgine (LAMICTAL) 150 MG tablet Take 150 mg by mouth daily.   Yes [provider]  metFORMIN (GLUCOPHAGE) 500 MG tablet  TAKE ONE TABLET TWICE DAILY WITH A MEAL 02/10/15  Yes Verner Chol, CNM  Na Sulfate-K Sulfate-Mg Sulf 17.5-3.13-1.6 GM/177ML SOLN Take by mouth. 02/12/23  Yes [provider]  spironolactone (ALDACTONE) 50 MG tablet Take 50 mg by mouth daily.   Yes [provider]  SYNTHROID 88 MCG tablet  02/10/19  Yes [provider]  venlafaxine XR (EFFEXOR-XR) 75 MG 24 hr capsule Take 75 mg by mouth daily with breakfast.   Yes [provider]  Vitamin D, Ergocalciferol, (DRISDOL) 50000 UNITS CAPS capsule Take 1 capsule by mouth once a week.   Yes [provider]  ALPRAZolam Prudy Feeler) 1 MG tablet Take 1 mg by mouth at bedtime as needed for sleep.    [provider]  aspirin-acetaminophen-caffeine (EXCEDRIN MIGRAINE) [phone] MG tablet Take by mouth every 6 (six) hours as needed for headache.    [provider]  Cholecalciferol (VITAMIN D PO) Take 2,000 Units by mouth daily.    [provider]  diphenhydrAMINE HCl (BENADRYL PO) Take by mouth as needed.    [provider]  ibuprofen (ADVIL) 800 MG tablet Take 1 tablet (800 mg total) by mouth every 8 (eight) hours as needed. Patient not taking: Reported on 02/12/2023 07/07/19   Romualdo Bolk, MD  loratadine (CLARITIN) 10 MG tablet Take 10 mg by mouth daily. Patient  not taking: Reported on 02/12/2023    [provider]  meclizine (ANTIVERT) 25 MG tablet Take 25 mg by mouth as needed for dizziness.    [provider]  tirzepatide Greggory Keen) 15 MG/0.5ML Pen Inject 15 mg into the skin once a week. 09/26/22       Current Outpatient Medications  Medication Sig Dispense Refill   amphetamine-dextroamphetamine (ADDERALL) 20 MG tablet Take 20 mg by mouth 3 (three) times daily.     lamoTRIgine (LAMICTAL) 150 MG tablet Take 150 mg by mouth daily.     metFORMIN (GLUCOPHAGE) 500 MG tablet TAKE ONE TABLET TWICE DAILY WITH A MEAL 60 tablet 0   Na Sulfate-K Sulfate-Mg Sulf  17.5-3.13-1.6 GM/177ML SOLN Take by mouth.     spironolactone (ALDACTONE) 50 MG tablet Take 50 mg by mouth daily.     SYNTHROID 88 MCG tablet      venlafaxine XR (EFFEXOR-XR) 75 MG 24 hr capsule Take 75 mg by mouth daily with breakfast.     Vitamin D, Ergocalciferol, (DRISDOL) 50000 UNITS CAPS capsule Take 1 capsule by mouth once a week.     ALPRAZolam (XANAX) 1 MG tablet Take 1 mg by mouth at bedtime as needed for sleep.     aspirin-acetaminophen-caffeine (EXCEDRIN MIGRAINE) 250-250-65 MG tablet Take by mouth every 6 (six) hours as needed for headache.     Cholecalciferol (VITAMIN D PO) Take 2,000 Units by mouth daily.     diphenhydrAMINE HCl (BENADRYL PO) Take by mouth as needed.     ibuprofen (ADVIL) 800 MG tablet Take 1 tablet (800 mg total) by mouth every 8 (eight) hours as needed. (Patient not taking: Reported on 02/12/2023) 30 tablet 1   loratadine (CLARITIN) 10 MG tablet Take 10 mg by mouth daily. (Patient not taking: Reported on 02/12/2023)     meclizine (ANTIVERT) 25 MG tablet Take 25 mg by mouth as needed for dizziness.     tirzepatide (MOUNJARO) 15 MG/0.5ML Pen Inject 15 mg into the skin once a week. 2 mL 3   Current Facility-Administered Medications  Medication Dose Route Frequency Provider Last Rate Last Admin   0.9 %  sodium chloride infusion  500 mL Intravenous Continuous Imogene Burn, MD        Allergies as of 02/21/2023   (No Known Allergies)    Family History  Problem Relation Age of Onset   Cancer Mother 62       melanoma   Migraines Mother    Hypertension Father    Hyperlipidemia Father    Migraines Sister    Cancer Maternal Grandmother    Ovarian cancer Paternal Grandmother    Heart disease Paternal Grandmother    Diabetes Paternal Grandmother    Cancer Paternal Grandmother 26       GYN cancer x 2, uterine (either ovarian or cervical)   Colon cancer Neg Hx    Esophageal cancer Neg Hx    Rectal cancer Neg Hx    Stomach cancer Neg Hx     Social History    Socioeconomic History   Marital status: Single    Spouse name: Not on file   Number of children: 0   Years of education: Not on file   Highest education level: Not on file  Occupational History   Not on file  Tobacco Use   Smoking status: Former    Types: Cigarettes   Smokeless tobacco: Never   Tobacco comments:    smoking off and on with a drink since  15. Stopped muliple times.   Vaping Use   Vaping status: Former  Substance and Sexual Activity   Alcohol use: Yes    Alcohol/week: 3.0 standard drinks of alcohol    Types: 3 Standard drinks or equivalent per week   Drug use: No   Sexual activity: Not Currently    Birth control/protection: Abstinence  Other Topics Concern   Not on file  Social History Narrative   Lives at home with her 2 dogs   Right handed   Caffeine: coffee in the morning, tea during the day. About 2 cups/day   Social Determinants of Health   Financial Resource Strain: Not on file  Food Insecurity: Not on file  Transportation Needs: Not on file  Physical Activity: Not on file  Stress: Not on file  Social Connections: Not on file  Intimate Partner Violence: Not on file    Physical Exam: Vital signs in last 24 hours: BP (!) 160/79   Pulse 100   Temp (!) 97.1 F (36.2 C) (Skin)   Ht 5\' 5"  (1.651 m)   Wt 162 lb (73.5 kg)   SpO2 100%   BMI 26.96 kg/m  GEN: NAD EYE: Sclerae anicteric ENT: MMM CV: Non-tachycardic Pulm: No increased work of breathing GI: Soft, NT/ND NEURO:  Alert & Oriented   Eulah Pont, MD [facility]  02/21/2023 11:55 AM

## 2023-02-21 NOTE — Progress Notes (Signed)
Spoke with Dr Leonides Schanz and Charlsie Merles CRNA, pt delayed until 1205 due to drinking upon arriving to [facility].  Pt states no solid food since Wednesday day and last bm was clear but dark yellow/brown you could see through.  Let pt and cp know they need to wait in lobby and we will not be able to do the procedure until 1205 for her safety, pt verb undetrstanding.

## 2023-02-21 NOTE — Progress Notes (Signed)
Brief Colonoscopy Report (Provation app down during procedure): Excellent prep. Normal terminal ileum. Internal hemorrhoids on rectal retroflexion. Repeat colonoscopy recommended in 10 years.

## 2023-02-21 NOTE — Progress Notes (Signed)
Report given to PACU, vss 

## 2023-02-24 ENCOUNTER — Telehealth: Payer: Self-pay

## 2023-02-24 NOTE — Telephone Encounter (Signed)
No answer, left message to call if having any issues or concerns, B.Schwartz RN 

## 2023-02-25 NOTE — Op Note (Signed)
[facility] Patient Name: Lisa Whitney Procedure Date: 02/25/2023 11:15 AM MRN: [id number] Endoscopist: Madelyn Brunner [place]itt[place]ore , , [id number] Age: 47 Referring MD:  Date of Birth: 07-05-1976 Gender: Female Account #: [id number] Procedure:                Colonoscopy Indications:              Screening for colorectal malignant neoplasm Medicines:                Monitored Anesthesia Care Procedure:                Pre-Anesthesia Assessment:                           - Prior to the procedure, a History and Physical                            was performed, and patient medications and                            allergies were reviewed. The patient's tolerance of                            previous anesthesia was also reviewed. The risks                            and benefits of the procedure and the sedation                            options and risks were discussed with the patient.                            All questions were answered, and informed consent                            was obtained. Prior Anticoagulants: The patient has                            taken no anticoagulant or antiplatelet agents. ASA                            Grade Assessment: II - A patient with mild systemic                            disease. After reviewing the risks and benefits,                            the patient was deemed in satisfactory condition to                            undergo the procedure.                           After obtaining informed consent, the colonoscope  was passed under direct vision. Throughout the                            procedure, the patient's blood pressure, pulse, and                            oxygen saturations were monitored continuously. The                            CF [id number] #[id number] was introduced through the anus                            and advanced to the the terminal ileum. The                             colonoscopy was performed without difficulty. The                            patient tolerated the procedure well. The quality                            of the bowel preparation was excellent. The                            terminal ileum, ileocecal valve, appendiceal                            orifice, and rectum were photographed. Findings:                 The terminal ileum appeared normal.                           Non-bleeding internal hemorrhoids were found during                            retroflexion. Complications:            No immediate complications. Estimated Blood Loss:     Estimated blood loss: none. Impression:               - The examined portion of the ileum was normal.                           - Non-bleeding internal hemorrhoids.                           - No specimens collected.                           - Scope in at 12:03 PM. Cecum time at 12:07 PM.                            Scope out time at 12:16 PM.                           - No  pictures were taken because Provation                            application was down during the procedure. Recommendation:           - Discharge patient to home (with escort).                           - Repeat colonoscopy in 10 years for screening                            purposes.                           - The findings and recommendations were discussed                            with the patient. Dr Particia Lather "[place]" August,  02/25/2023 11:20:09 AM

## 2023-03-04 ENCOUNTER — Other Ambulatory Visit: Payer: Self-pay | Admitting: Internal Medicine

## 2023-03-04 DIAGNOSIS — Z1231 Encounter for screening mammogram for malignant neoplasm of breast: Secondary | ICD-10-CM

## 2023-03-07 ENCOUNTER — Ambulatory Visit
Admission: RE | Admit: 2023-03-07 | Discharge: 2023-03-07 | Disposition: A | Discharge status: Home / Self Care | Payer: BC Managed Care – PPO | Source: Ambulatory Visit | Attending: Internal Medicine | Admitting: Internal Medicine

## 2023-03-07 DIAGNOSIS — Z1231 Encounter for screening mammogram for malignant neoplasm of breast: Secondary | ICD-10-CM

## 2023-03-14 ENCOUNTER — Ambulatory Visit: Payer: BC Managed Care – PPO | Admitting: Radiology

## 2023-05-20 ENCOUNTER — Encounter: Payer: BC Managed Care – PPO | Admitting: Radiology

## 2023-06-04 NOTE — Progress Notes (Signed)
No show

## 2023-08-12 ENCOUNTER — Encounter (HOSPITAL_BASED_OUTPATIENT_CLINIC_OR_DEPARTMENT_OTHER): Payer: Self-pay | Admitting: Emergency Medicine

## 2023-08-12 ENCOUNTER — Other Ambulatory Visit: Payer: Self-pay

## 2023-08-12 ENCOUNTER — Emergency Department (HOSPITAL_BASED_OUTPATIENT_CLINIC_OR_DEPARTMENT_OTHER)
Admission: EM | Admit: 2023-08-12 | Discharge: 2023-08-12 | Disposition: A | Discharge status: Home / Self Care | Payer: BC Managed Care – PPO | Attending: Emergency Medicine | Admitting: Emergency Medicine

## 2023-08-12 DIAGNOSIS — J069 Acute upper respiratory infection, unspecified: Secondary | ICD-10-CM | POA: Insufficient documentation

## 2023-08-12 DIAGNOSIS — Z794 Long term (current) use of insulin: Secondary | ICD-10-CM | POA: Insufficient documentation

## 2023-08-12 DIAGNOSIS — R059 Cough, unspecified: Secondary | ICD-10-CM | POA: Diagnosis present

## 2023-08-12 DIAGNOSIS — Z20822 Contact with and (suspected) exposure to covid-19: Secondary | ICD-10-CM | POA: Diagnosis not present

## 2023-08-12 DIAGNOSIS — R0602 Shortness of breath: Secondary | ICD-10-CM | POA: Diagnosis not present

## 2023-08-12 LAB — RESP PANEL BY RT-PCR (RSV, FLU A&B, COVID)  RVPGX2
Influenza A by PCR: NEGATIVE
Influenza B by PCR: NEGATIVE
Resp Syncytial Virus by PCR: POSITIVE — AB
SARS Coronavirus 2 by RT PCR: NEGATIVE

## 2023-08-12 MED ORDER — BENZONATATE 100 MG PO CAPS: 100.0000 mg | ORAL_CAPSULE | Freq: Three times a day (TID) | ORAL | 0 refills | Status: AC

## 2023-08-12 MED ORDER — ONDANSETRON 4 MG PO TBDP: ORAL_TABLET | ORAL | 0 refills | Status: AC

## 2023-08-12 NOTE — ED Triage Notes (Signed)
 C/o productive cough, sore throat, congestion, and weakness starting last night.

## 2023-08-12 NOTE — Discharge Instructions (Signed)
 Take tylenol 2 pills 4 times a day and motrin 4 pills 3 times a day.  Drink plenty of fluids.  Return for worsening shortness of breath, headache, confusion. Follow up with your family doctor.

## 2023-08-12 NOTE — ED Notes (Signed)
 Pt requested a Chest xray, blood work and a strep test before being discharged... Provider informed, talked to the Pt again and discharged Pt... Pt was alert and oriented during DC... Pt's V/S as noted... Pt was DC'd before assessment could be performed... Pt refused further V/S before leaving the ED.[email or web address]

## 2023-08-12 NOTE — ED Provider Notes (Addendum)
 [place] EMERGENCY DEPARTMENT AT [facility] Provider Note   CSN: [id number] Arrival date & time: 08/12/23  9088     History  Chief Complaint  Patient presents with   Cough    Lisa Whitney is a 48 y.o. female.  48 yo F with a chief complaint of cough congestion sore throat going on since last night.  She feels like she has been coughing up greenish sputum.  Felt like her chest was tight and she is having difficulty breathing last night.  No known sick contacts no recent travel.   Cough      Home Medications Prior to Admission medications   Medication Sig Start Date End Date Taking? Authorizing Provider  benzonatate  (TESSALON ) 100 MG capsule Take 1 capsule (100 mg total) by mouth every 8 (eight) hours. 08/12/23  Yes Emil Share, DO  ondansetron  (ZOFRAN -ODT) 4 MG disintegrating tablet 4mg  ODT q4 hours prn nausea/vomit 08/12/23  Yes Thania Woodlief, DO  ALPRAZolam (XANAX) 1 MG tablet Take 1 mg by mouth at bedtime as needed for sleep.    [provider]  amphetamine-dextroamphetamine (ADDERALL) 20 MG tablet Take 20 mg by mouth 3 (three) times daily. 02/20/23   [provider]  aspirin-acetaminophen-caffeine (EXCEDRIN MIGRAINE) 250-250-65 MG tablet Take by mouth every 6 (six) hours as needed for headache.    [provider]  Cholecalciferol (VITAMIN D PO) Take 2,000 Units by mouth daily.    [provider]  diphenhydrAMINE HCl (BENADRYL PO) Take by mouth as needed.    [provider]  ibuprofen  (ADVIL ) 800 MG tablet Take 1 tablet (800 mg total) by mouth every 8 (eight) hours as needed. Patient not taking: Reported on 02/12/2023 07/07/19   Jertson, Jill Evelyn, MD  lamoTRIgine (LAMICTAL) 150 MG tablet Take 150 mg by mouth daily.    [provider]  loratadine (CLARITIN) 10 MG tablet Take 10 mg by mouth daily. Patient not taking: Reported on 02/12/2023    [provider]  meclizine (ANTIVERT) 25 MG tablet Take 25 mg  by mouth as needed for dizziness.    [provider]  metFORMIN  (GLUCOPHAGE ) 500 MG tablet TAKE ONE TABLET TWICE DAILY WITH A MEAL 02/10/15   Rodgers Barnie RAMAN, CNM  Na Sulfate-K Sulfate-Mg Sulf 17.5-3.13-1.6 GM/177ML SOLN Take by mouth. 02/12/23   [provider]  spironolactone (ALDACTONE) 50 MG tablet Take 50 mg by mouth daily.    [provider]  SYNTHROID 88 MCG tablet  02/10/19   [provider]  tirzepatide  (MOUNJARO ) 15 MG/0.5ML Pen Inject 15 mg into the skin once a week. 09/26/22     venlafaxine XR (EFFEXOR-XR) 75 MG 24 hr capsule Take 75 mg by mouth daily with breakfast.    [provider]  Vitamin D, Ergocalciferol, (DRISDOL) 50000 UNITS CAPS capsule Take 1 capsule by mouth once a week.    [provider]      Allergies    Patient has no known allergies.    Review of Systems   Review of Systems  Respiratory:  Positive for cough.     Physical Exam Updated Vital Signs BP (!) 148/83 (BP Location: Left Arm)   Pulse 98   Temp 99.4 F (37.4 C) (Oral)   Resp 20   SpO2 100%  Physical Exam Vitals and nursing note reviewed.  Constitutional:      General: She is not in acute distress.    Appearance: She is well-developed. She is not diaphoretic.  HENT:  Head: Normocephalic and atraumatic.     Comments: Swollen turbinates, posterior nasal drip, tm normal bilaterally.   Eyes:     Pupils: Pupils are equal, round, and reactive to light.  Cardiovascular:     Rate and Rhythm: Normal rate and regular rhythm.     Heart sounds: No murmur heard.    No friction rub. No gallop.  Pulmonary:     Effort: Pulmonary effort is normal.     Breath sounds: No wheezing or rales.  Abdominal:     General: There is no distension.     Palpations: Abdomen is soft.     Tenderness: There is no abdominal tenderness.  Musculoskeletal:        General: No tenderness.     Cervical back: Normal range of motion and neck supple.  Skin:    General:  Skin is warm and dry.  Neurological:     Mental Status: She is alert and oriented to person, place, and time.  Psychiatric:        Behavior: Behavior normal.     ED Results / Procedures / Treatments   Labs (all labs ordered are listed, but only abnormal results are displayed) Labs Reviewed  RESP PANEL BY RT-PCR (RSV, FLU A&B, COVID)  [id number]    EKG None  Radiology No results found.  Procedures Procedures    Medications Ordered in ED Medications - No data to display  ED Course/ Medical Decision Making/ A&P                                 Medical Decision Making Risk Prescription drug management.   48 yo F with a chief complaints of cough congestion sore throat myalgias going on since last night.  Patient has clear lung sounds no tachypnea.  No bacterial source found otherwise on exam.  Patient inquired about antibiotic use and also stressed that she wanted know exactly what was wrong with her.  I discussed the limitations of modern medicine and discussed symptomatic therapy for viral syndrome.  Will prescribe cough and nausea medicine.  PCP follow-up.  9:57 AM I was notified by the nursing staff that the patient did not feel ready to leave.  She wanted to wait for her COVID flu and RSV testing to result.  She also is requesting an x-ray, strep testing and lab work.  Discussed with her I did not feel that this was warranted.  I encouraged her again to follow-up with her family doctor in the office.   9:41 AM:  I have discussed the diagnosis/risks/treatment options with the patient.  Evaluation and diagnostic testing in the emergency department does not suggest an emergent condition requiring admission or immediate intervention beyond what has been performed at this time.  They will follow up with PCP. We also discussed returning to the ED immediately if new or worsening sx occur. We discussed the sx which are most concerning (e.g., sudden worsening pain, fever, inability to  tolerate by mouth) that necessitate immediate return. Medications administered to the patient during their visit and any new prescriptions provided to the patient are listed below.  Medications given during this visit Medications - No data to display   The patient appears reasonably screen and/or stabilized for discharge and I doubt any other medical condition or other [facility] requiring further screening, evaluation, or treatment in the ED at this time prior to discharge.  Final Clinical Impression(s) / ED Diagnoses Final diagnoses:  Viral URI with cough    Rx / DC Orders ED Discharge Orders          Ordered    benzonatate  (TESSALON ) 100 MG capsule  Every 8 hours        08/12/23 0936    ondansetron  (ZOFRAN -ODT) 4 MG disintegrating tablet        08/12/23 0936              Emil Share, DO 08/12/23 [phone]
